# Patient Record
Sex: Male | Born: 1975 | Race: White | Hispanic: No | Marital: Single | Smoking: Current every day smoker
Health system: Southern US, Community
[De-identification: ages and names within clinical notes are randomized; demographics above are authoritative.]

## PROBLEM LIST (undated history)

## (undated) DIAGNOSIS — R911 Solitary pulmonary nodule: Secondary | ICD-10-CM

## (undated) DIAGNOSIS — J45909 Unspecified asthma, uncomplicated: Secondary | ICD-10-CM

## (undated) DIAGNOSIS — K759 Inflammatory liver disease, unspecified: Secondary | ICD-10-CM

## (undated) DIAGNOSIS — K219 Gastro-esophageal reflux disease without esophagitis: Secondary | ICD-10-CM

## (undated) DIAGNOSIS — R7303 Prediabetes: Secondary | ICD-10-CM

## (undated) HISTORY — DX: Unspecified asthma, uncomplicated: J45.909

## (undated) HISTORY — PX: GANGLION CYST EXCISION: SHX1691

## (undated) HISTORY — DX: Solitary pulmonary nodule: R91.1

## (undated) HISTORY — PX: OTHER SURGICAL HISTORY: SHX169

## (undated) HISTORY — DX: Gastro-esophageal reflux disease without esophagitis: K21.9

## (undated) HISTORY — DX: Inflammatory liver disease, unspecified: K75.9

---

## 2004-12-18 ENCOUNTER — Emergency Department: Payer: Self-pay | Admitting: General Practice

## 2005-03-23 ENCOUNTER — Emergency Department: Payer: Self-pay | Admitting: Emergency Medicine

## 2005-07-25 ENCOUNTER — Ambulatory Visit: Payer: Self-pay

## 2008-05-19 ENCOUNTER — Emergency Department: Payer: Self-pay | Admitting: Emergency Medicine

## 2008-05-19 ENCOUNTER — Other Ambulatory Visit: Payer: Self-pay

## 2008-05-20 ENCOUNTER — Other Ambulatory Visit: Payer: Self-pay

## 2008-05-21 ENCOUNTER — Other Ambulatory Visit: Payer: Self-pay

## 2008-05-21 ENCOUNTER — Emergency Department: Payer: Self-pay | Admitting: Emergency Medicine

## 2008-12-30 ENCOUNTER — Ambulatory Visit: Payer: Self-pay | Admitting: Orthopedic Surgery

## 2009-01-03 ENCOUNTER — Ambulatory Visit: Payer: Self-pay | Admitting: Orthopedic Surgery

## 2010-07-30 ENCOUNTER — Emergency Department: Payer: Self-pay | Admitting: Emergency Medicine

## 2016-03-06 ENCOUNTER — Emergency Department (HOSPITAL_COMMUNITY): Payer: BLUE CROSS/BLUE SHIELD

## 2016-03-06 ENCOUNTER — Emergency Department (HOSPITAL_COMMUNITY)
Admission: EM | Admit: 2016-03-06 | Discharge: 2016-03-07 | Disposition: A | Discharge status: Home / Self Care | Payer: BLUE CROSS/BLUE SHIELD | Attending: Emergency Medicine | Admitting: Emergency Medicine

## 2016-03-06 ENCOUNTER — Encounter (HOSPITAL_COMMUNITY): Payer: Self-pay

## 2016-03-06 DIAGNOSIS — F17200 Nicotine dependence, unspecified, uncomplicated: Secondary | ICD-10-CM | POA: Diagnosis not present

## 2016-03-06 DIAGNOSIS — R109 Unspecified abdominal pain: Secondary | ICD-10-CM | POA: Insufficient documentation

## 2016-03-06 DIAGNOSIS — R0602 Shortness of breath: Secondary | ICD-10-CM | POA: Insufficient documentation

## 2016-03-06 DIAGNOSIS — Z880 Allergy status to penicillin: Secondary | ICD-10-CM | POA: Insufficient documentation

## 2016-03-06 DIAGNOSIS — Z8719 Personal history of other diseases of the digestive system: Secondary | ICD-10-CM | POA: Diagnosis not present

## 2016-03-06 DIAGNOSIS — R0789 Other chest pain: Secondary | ICD-10-CM | POA: Diagnosis not present

## 2016-03-06 DIAGNOSIS — R5381 Other malaise: Secondary | ICD-10-CM | POA: Insufficient documentation

## 2016-03-06 DIAGNOSIS — R079 Chest pain, unspecified: Secondary | ICD-10-CM | POA: Diagnosis present

## 2016-03-06 DIAGNOSIS — R61 Generalized hyperhidrosis: Secondary | ICD-10-CM | POA: Diagnosis not present

## 2016-03-06 HISTORY — DX: Prediabetes: R73.03

## 2016-03-06 LAB — BASIC METABOLIC PANEL
Anion gap: 9 (ref 5–15)
BUN: 16 mg/dL (ref 6–20)
CHLORIDE: 109 mmol/L (ref 101–111)
CO2: 23 mmol/L (ref 22–32)
Calcium: 8.8 mg/dL — ABNORMAL LOW (ref 8.9–10.3)
Creatinine, Ser: 1.04 mg/dL (ref 0.61–1.24)
GFR calc Af Amer: >60 mL/min (ref >60)
GFR calc non Af Amer: >60 mL/min (ref >60)
GLUCOSE: 81 mg/dL (ref 65–99)
POTASSIUM: 4.4 mmol/L (ref 3.5–5.1)
Sodium: 141 mmol/L (ref 135–145)

## 2016-03-06 LAB — CBC
HEMATOCRIT: 48.1 % (ref 39.0–52.0)
Hemoglobin: 16.5 g/dL (ref 13.0–17.0)
MCH: 31.3 pg (ref 26.0–34.0)
MCHC: 34.3 g/dL (ref 30.0–36.0)
MCV: 91.3 fL (ref 78.0–100.0)
Platelets: 352 10*3/uL (ref 150–400)
RBC: 5.27 MIL/uL (ref 4.22–5.81)
RDW: 13.2 % (ref 11.5–15.5)
WBC: 9.2 10*3/uL (ref 4.0–10.5)

## 2016-03-06 LAB — I-STAT TROPONIN, ED
TROPONIN I, POC: 0.02 ng/mL (ref 0.00–0.08)
Troponin i, poc: 0 ng/mL (ref 0.00–0.08)

## 2016-03-06 LAB — LIPASE, BLOOD: Lipase: 30 U/L (ref 11–51)

## 2016-03-06 MED ORDER — FAMOTIDINE 20 MG PO TABS
20.0000 mg | ORAL_TABLET | Freq: Once | ORAL | Status: AC
  Administered 2016-03-07: 20 mg via ORAL
  Filled 2016-03-06: qty 1

## 2016-03-06 MED ORDER — GI COCKTAIL ~~LOC~~
30.0000 mL | Freq: Once | ORAL | Status: AC
  Administered 2016-03-06: 30 mL via ORAL
  Filled 2016-03-06: qty 30

## 2016-03-06 NOTE — ED Provider Notes (Signed)
CSN: [id number]     Arrival date & time 03/06/16  1734 History   First MD Initiated Contact with Patient 03/06/16 2030     Chief Complaint  Patient presents with  . Chest Pain     (Consider location/radiation/quality/duration/timing/severity/associated sxs/prior Treatment) HPI   Blood pressure 140/87, pulse 71, temperature 98.2 F (36.8 C), temperature source Oral, resp. rate 20, height 6\' 3"  (1.905 m), weight 114.306 kg, SpO2 96 %.  Frank Stevens is a 40 y.o. male  with PMHx significant for GERD, tobacco use and pre-diabetes who presents to the ED after 30 minute episode of CP. Pt reports CP is dull, and radiates to the left arm and shoulder. Associated sxs were "uncomfortable" abdominal pain, malaise, SOB, and diaphoresis. Pt reports he presented to the healthcare provider on staff at his place of work and she measured BP, BG, which were both WNL and gave him 1 dose of ASA which provided moderate relief. Pt reports this episode was followed by two similar CP episodes which resolved without intervention. Pt reports he is screened regularly with this workplace provider. Pt notes he had eaten [employer] about 1-1.5 hours before first episode. Pt denies fever, N/V, HA, changes in bowel movements, lower extremity pain or edema, or recent surgery or travel.   Past Medical History  Diagnosis Date  . Pre-diabetes    History reviewed. No pertinent past surgical history. No family history on file. Social History  Substance Use Topics  . Smoking status: Current Every Day Smoker  . Smokeless tobacco: None  . Alcohol Use: No    Review of Systems  10 systems reviewed and found to be negative, except as noted in the HPI.   Allergies  Penicillins  Home Medications   Prior to Admission medications   Not on File   BP 140/87 mmHg  Pulse 71  Temp(Src) 98.2 F (36.8 C) (Oral)  Resp 20  Ht 6\' 3"  (1.905 m)  Wt 114.306 kg  BMI 31.50 kg/m2  SpO2 96% Physical Exam  Constitutional: He is  oriented to person, place, and time. He appears well-developed and well-nourished. No distress.  HENT:  Head: Normocephalic and atraumatic.  Mouth/Throat: Oropharynx is clear and moist.  Eyes: Conjunctivae and EOM are normal. Pupils are equal, round, and reactive to light.  Neck: Normal range of motion. No JVD present. No tracheal deviation present.  Cardiovascular: Normal rate, regular rhythm and intact distal pulses.   Radial pulse equal bilaterally  Pulmonary/Chest: Effort normal and breath sounds normal. No stridor. No respiratory distress. He has no wheezes. He has no rales. He exhibits no tenderness.  Abdominal: Soft. He exhibits no distension and no mass. There is no tenderness. There is no rebound and no guarding.  Musculoskeletal: Normal range of motion. He exhibits no edema or tenderness.  No calf asymmetry, superficial collaterals, palpable cords, edema, Homans sign negative bilaterally.    Neurological: He is alert and oriented to person, place, and time.  Skin: Skin is warm. He is not diaphoretic.  Psychiatric: He has a normal mood and affect.  Nursing note and vitals reviewed.   ED Course  Procedures (including critical care time) Labs Review Labs Reviewed  BASIC METABOLIC PANEL - Abnormal; Notable for the following:    Calcium 8.8 (*)    All other components within normal limits  CBC  I-STAT TROPOININ, ED    Imaging Review Dg Chest 2 View  03/06/2016  CLINICAL DATA:  40 year old male with chest pain and shortness of  breath EXAM: CHEST  2 VIEW COMPARISON:  Chest radiograph dated 05/21/2008 FINDINGS: The heart size and mediastinal contours are within normal limits. Both lungs are clear. The visualized skeletal structures are unremarkable. IMPRESSION: No active cardiopulmonary disease. Electronically Signed   By: Anner Crete M.D.   On: 03/06/2016 18:32   I have personally reviewed and evaluated these images and lab results as part of my medical decision-making.    EKG Interpretation None      MDM   Final diagnoses:  Atypical chest pain  Tobacco use disorder    Filed Vitals:   03/06/16 2130 03/06/16 2145 03/06/16 2200 03/06/16 2215  BP: 131/71 135/83 130/84 138/92  Pulse: 65 65 61 68  Temp:      TempSrc:      Resp: 20 17 14 12   Height:      Weight:      SpO2: 98% 97% 96% 98%    Medications  gi cocktail (Maalox,Lidocaine,Donnatal) (30 mLs Oral Given 03/06/16 2123)    Frank Stevens is 40 y.o. male presenting with Chest pain radiating to the left shoulder with associated shortness of breath and diaphoresis, he said 3 episodes lasting approximately 30 minutes, this appears to be tied to eating. Patient has history of GERD, he is prediabetic and also a smoker. Low risk by heart score, EKG with no acute abnormalities, troponin is negative, patient will be given GI cocktail, will check LFTs and  Trop 0.02, not crossing over, Patient given cardiology referral, since he discussed cessation.  Evaluation does not show pathology that would require ongoing emergent intervention or inpatient treatment. Pt is hemodynamically stable and mentating appropriately. Discussed findings and plan with patient/guardian, who agrees with care plan. All questions answered. Return precautions discussed and outpatient follow up given.     Monico Blitz, PA-C 03/07/16 [place], MD 03/08/16 [phone]

## 2016-03-06 NOTE — ED Notes (Signed)
Pt states he started having left side CP that radiated down his left arm today around 1500 associated with SOB and sweating, "I was feeling really off."

## 2016-03-07 LAB — HEPATIC FUNCTION PANEL
ALT: 36 U/L (ref 17–63)
AST: 25 U/L (ref 15–41)
Albumin: 4.1 g/dL (ref 3.5–5.0)
Alkaline Phosphatase: 60 U/L (ref 38–126)
BILIRUBIN DIRECT: 0.2 mg/dL (ref 0.1–0.5)
BILIRUBIN INDIRECT: 0.3 mg/dL (ref 0.3–0.9)
TOTAL PROTEIN: 7.1 g/dL (ref 6.5–8.1)
Total Bilirubin: 0.5 mg/dL (ref 0.3–1.2)

## 2016-03-07 NOTE — Discharge Instructions (Signed)
Please follow with your primary care doctor in the next 2 days for a check-up. They must obtain records for further management.  ° °Do not hesitate to return to the Emergency Department for any new, worsening or concerning symptoms.  ° ° °Nonspecific Chest Pain  °Chest pain can be caused by many different conditions. There is always a chance that your pain could be related to something serious, such as a heart attack or a blood clot in your lungs. Chest pain can also be caused by conditions that are not life-threatening. If you have chest pain, it is very important to follow up with your health care provider. °CAUSES  °Chest pain can be caused by: °· Heartburn. °· Pneumonia or bronchitis. °· Anxiety or stress. °· Inflammation around your heart (pericarditis) or lung (pleuritis or pleurisy). °· A blood clot in your lung. °· A collapsed lung (pneumothorax). It can develop suddenly on its own (spontaneous pneumothorax) or from trauma to the chest. °· Shingles infection (varicella-zoster virus). °· Heart attack. °· Damage to the bones, muscles, and cartilage that make up your chest wall. This can include: °¨ Bruised bones due to injury. °¨ Strained muscles or cartilage due to frequent or repeated coughing or overwork. °¨ Fracture to one or more ribs. °¨ Sore cartilage due to inflammation (costochondritis). °RISK FACTORS  °Risk factors for chest pain may include: °· Activities that increase your risk for trauma or injury to your chest. °· Respiratory infections or conditions that cause frequent coughing. °· Medical conditions or overeating that can cause heartburn. °· Heart disease or family history of heart disease. °· Conditions or health behaviors that increase your risk of developing a blood clot. °· Having had chicken pox (varicella zoster). °SIGNS AND SYMPTOMS °Chest pain can feel like: °· Burning or tingling on the surface of your chest or deep in your chest. °· Crushing, pressure, aching, or squeezing  pain. °· Dull or sharp pain that is worse when you move, cough, or take a deep breath. °· Pain that is also felt in your back, neck, shoulder, or arm, or pain that spreads to any of these areas. °Your chest pain may come and go, or it may stay constant. °DIAGNOSIS °Lab tests or other studies may be needed to find the cause of your pain. Your health care provider may have you take a test called an ambulatory ECG (electrocardiogram). An ECG records your heartbeat patterns at the time the test is performed. You may also have other tests, such as: °· Transthoracic echocardiogram (TTE). During echocardiography, sound waves are used to create a picture of all of the heart structures and to look at how blood flows through your heart. °· Transesophageal echocardiogram (TEE). This is a more advanced imaging test that obtains images from inside your body. It allows your health care provider to see your heart in finer detail. °· Cardiac monitoring. This allows your health care provider to monitor your heart rate and rhythm in real time. °· Holter monitor. This is a portable device that records your heartbeat and can help to diagnose abnormal heartbeats. It allows your health care provider to track your heart activity for several days, if needed. °· Stress tests. These can be done through exercise or by taking medicine that makes your heart beat more quickly. °· Blood tests. °· Imaging tests. °TREATMENT  °Your treatment depends on what is causing your chest pain. Treatment may include: °· Medicines. These may include: °¨ Acid blockers for heartburn. °¨ Anti-inflammatory medicine. °¨ Pain   medicine for inflammatory conditions.  Antibiotic medicine, if an infection is present.  Medicines to dissolve blood clots.  Medicines to treat coronary artery disease.  Supportive care for conditions that do not require medicines. This may include:  Resting.  Applying heat or cold packs to injured areas.  Limiting activities  until pain decreases. HOME CARE INSTRUCTIONS  If you were prescribed an antibiotic medicine, finish it all even if you start to feel better.  Avoid any activities that bring on chest pain.  Do not use any tobacco products, including cigarettes, chewing tobacco, or electronic cigarettes. If you need help quitting, ask your health care provider.  Do not drink alcohol.  Take medicines only as directed by your health care provider.  Keep all follow-up visits as directed by your health care provider. This is important. This includes any further testing if your chest pain does not go away.  If heartburn is the cause for your chest pain, you may be told to keep your head raised (elevated) while sleeping. This reduces the chance that acid will go from your stomach into your esophagus.  Make lifestyle changes as directed by your health care provider. These may include:  Getting regular exercise. Ask your health care provider to suggest some activities that are safe for you.  Eating a heart-healthy diet. A registered dietitian can help you to learn healthy eating options.  Maintaining a healthy weight.  Managing diabetes, if necessary.  Reducing stress. SEEK MEDICAL CARE IF:  Your chest pain does not go away after treatment.  You have a rash with blisters on your chest.  You have a fever. SEEK IMMEDIATE MEDICAL CARE IF:   Your chest pain is worse.  You have an increasing cough, or you cough up blood.  You have severe abdominal pain.  You have severe weakness.  You faint.  You have chills.  You have sudden, unexplained chest discomfort.  You have sudden, unexplained discomfort in your arms, back, neck, or jaw.  You have shortness of breath at any time.  You suddenly start to sweat, or your skin gets clammy.  You feel nauseous or you vomit.  You suddenly feel light-headed or dizzy.  Your heart begins to beat quickly, or it feels like it is skipping beats. These  symptoms may represent a serious problem that is an emergency. Do not wait to see if the symptoms will go away. Get medical help right away. Call your local emergency services (911 in the [place] Do not drive yourself to the hospital.   This information is not intended to replace advice given to you by your health care provider. Make sure you discuss any questions you have with your health care provider.   Document Released: 07/25/2005 Document Revised: 11/05/2014 Document Reviewed: 05/21/2014 Elsevier Interactive Patient Education [employer].

## 2017-02-22 LAB — VITAMIN D 25 HYDROXY (VIT D DEFICIENCY, FRACTURES): Vit D, 25-Hydroxy: 19.5

## 2017-02-22 LAB — CBC AND DIFFERENTIAL
HEMATOCRIT: 49 (ref 41–53)
HEMOGLOBIN: 17.3 (ref 13.5–17.5)
Neutrophils Absolute: 6
Platelets: 361 (ref 150–399)
WBC: 9.4

## 2017-02-22 LAB — BASIC METABOLIC PANEL
BUN: 12 (ref 4–21)
CREATININE: 1 (ref 0.6–1.3)
Glucose: 98
POTASSIUM: 4.7 (ref 3.4–5.3)
SODIUM: 139 (ref 137–147)

## 2017-02-22 LAB — HEMOGLOBIN A1C: HEMOGLOBIN A1C: 5.9

## 2017-02-22 LAB — LIPID PANEL
CHOLESTEROL: 178 (ref 0–200)
HDL: 28 — AB (ref 35–70)
LDL Cholesterol: 103
TRIGLYCERIDES: 237 — AB (ref 40–160)

## 2017-02-22 LAB — HEPATIC FUNCTION PANEL
ALT: 14 (ref 10–40)
AST: 15 (ref 14–40)
Alkaline Phosphatase: 61 (ref 25–125)
BILIRUBIN, TOTAL: 0.4

## 2017-02-22 LAB — PSA: PSA: 1.2

## 2017-02-22 LAB — TSH: TSH: 0.72 (ref 0.41–5.90)

## 2017-02-22 LAB — IRON,TIBC AND FERRITIN PANEL: IRON: 118

## 2018-09-30 ENCOUNTER — Encounter: Payer: Self-pay | Admitting: Family Medicine

## 2018-09-30 ENCOUNTER — Ambulatory Visit (INDEPENDENT_AMBULATORY_CARE_PROVIDER_SITE_OTHER): Payer: 59 | Admitting: Family Medicine

## 2018-09-30 VITALS — BP 134/76 | HR 83 | Temp 98.3°F | Resp 12 | Ht 75.0 in | Wt 269.0 lb

## 2018-09-30 DIAGNOSIS — Z0000 Encounter for general adult medical examination without abnormal findings: Secondary | ICD-10-CM | POA: Diagnosis not present

## 2018-09-30 DIAGNOSIS — Z1331 Encounter for screening for depression: Secondary | ICD-10-CM

## 2018-09-30 DIAGNOSIS — E669 Obesity, unspecified: Secondary | ICD-10-CM | POA: Diagnosis not present

## 2018-09-30 DIAGNOSIS — Z720 Tobacco use: Secondary | ICD-10-CM

## 2018-09-30 MED ORDER — NICOTINE 14 MG/24HR TD PT24: 14.0000 mg | MEDICATED_PATCH | Freq: Every day | TRANSDERMAL | 0 refills | Status: AC

## 2018-09-30 MED ORDER — NICOTINE 7 MG/24HR TD PT24: 7.0000 mg | MEDICATED_PATCH | Freq: Every day | TRANSDERMAL | 0 refills | Status: AC

## 2018-09-30 MED ORDER — NICOTINE 21 MG/24HR TD PT24: 21.0000 mg | MEDICATED_PATCH | Freq: Every day | TRANSDERMAL | 0 refills | Status: AC

## 2018-09-30 NOTE — Patient Instructions (Addendum)
When you get your labs - have them sent to [place].   Based on what you have drawn at work, we may need to have to come back for a lab appointment  Quitting smoking is the best thing you can do for your health!!!  Here is how you should use the patches  Week 1-4: 21 mg patch Week 5&6: 14 mg patch Week 7&8: 7 mg patch  Then stop.   You may still get cravings for cigarettes. Sometimes it is helpful to pick a specific Quit Date - the last day you will have a cigarette.

## 2018-09-30 NOTE — Assessment & Plan Note (Signed)
Interested in diet/exercise - but elected to focus on quitting smoking for the next few months. Will readdress lifestyle later. Due for labs at work - will give them to the clinic. Plan to review for lipids, CMP, and HgbA1c.

## 2018-09-30 NOTE — Progress Notes (Signed)
Subjective:     Frank Stevens is a 42 y.o. 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, second time had too many side effects - tried vaping w/o success - nicotine inhalers/patches - considering hypnotizing    #weight  - recognizes that he is overweight - tried a shake diet - with some success but then gained it back - works 12 hour days, 6 days a week - averages 15,000 steps per day currently with work  Immunizations - flu up to date - not sure about Tetanus  Gets labs with work - due to for labs in 3 weeks - will follow-up for cholesterol and diabetes screening  Cancer screening - none due at this time  Vision issues - competed course of steroid eye drop - will get watery eyes while watching TV and get blurry vision    Review of Systems  Constitutional: Negative for chills and fever.  HENT: Negative for congestion, ear pain and sinus pain.   Eyes: Positive for discharge (watery).  Respiratory: Positive for cough and wheezing. Negative for chest tightness and shortness of breath.   Cardiovascular: Positive for leg swelling (chronic left knee). Negative for chest pain and palpitations.  Gastrointestinal: Negative for abdominal pain, nausea and vomiting.  Endocrine: Negative for cold intolerance and heat intolerance.  Genitourinary: Negative.   Musculoskeletal: Negative for arthralgias and myalgias.  Skin: Negative for rash.  Allergic/Immunologic: Negative.   Neurological: Negative for dizziness, weakness, numbness and headaches.  Hematological: Does not bruise/bleed easily.  Psychiatric/Behavioral: Negative for dysphoric mood. The patient is not nervous/anxious.      Social History   Tobacco Use  Smoking Status Current Every Day Smoker  . Packs/day: 1.50  . Years: 30.00  . Pack years: 45.00  . Types: Cigarettes  Smokeless Tobacco Current User  . Types: Chew   Past  Medical History:  Diagnosis Date  . Asthma    childhood  . GERD (gastroesophageal reflux disease)   . Hepatitis    age 70  . Pre-diabetes    Past Surgical History:  Procedure Laterality Date  . GANGLION CYST EXCISION Left   . lymph node     lymph node removed under left arm due to cat scratch fever. as a kid   Family History  Problem Relation Age of Onset  . Arthritis Mother   . COPD Mother   . Diabetes Mother   . Heart disease Mother   . Colon cancer Mother 16  . Congestive Heart Failure Mother   . Alcohol abuse Father   . Drug abuse Father   . Early death Father   . Kidney disease Father   . Cirrhosis Father   . Asthma Sister    Social History   Socioeconomic History  . Marital status: Married    Spouse name: Anderson Malta  . Number of children: 1  . Years of education: GED, associate degree  . Highest education level: Not on file  Occupational History  . Not on file  Social Needs  . Financial resource strain: Not hard at all  . Food insecurity:    Worry: Not on file    Inability: Not on file  . Transportation needs:    Medical: Not on file    Non-medical: Not on file  Tobacco Use  . Smoking status: Current Every Day Smoker    Packs/day: 1.50    Years: 30.00  Pack years: 45.00    Types: Cigarettes  . Smokeless tobacco: Current User    Types: Chew  Substance and Sexual Activity  . Alcohol use: Yes    Comment: 1-2 times per year  . Drug use: Not Currently  . Sexual activity: Yes    Birth control/protection: Pill  Lifestyle  . Physical activity:    Days per week: Not on file    Minutes per session: Not on file  . Stress: Not on file  Relationships  . Social connections:    Talks on phone: Not on file    Gets together: Not on file    Attends religious service: Not on file    Active member of club or organization: Not on file    Attends meetings of clubs or organizations: Not on file    Relationship status: Not on file  Other Topics Concern  . 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 He appears well-developed and well-nourished. No distress.  HENT:  Head: Normocephalic and atraumatic.  Right Ear: Tympanic membrane and ear canal normal.  Left Ear: Tympanic membrane and ear canal normal.  Nose: Nose normal.  Mouth/Throat: Uvula is midline and oropharynx is clear and moist.  Eyes: Pupils are equal, round, and reactive to light. Conjunctivae and EOM are normal. No scleral icterus.  Neck: Normal range of motion. Neck supple.  Cardiovascular: Normal rate, regular rhythm and normal heart sounds.  No murmur heard. Pulmonary/Chest: Effort normal and breath sounds normal. No respiratory distress. He has no wheezes.  Abdominal: Soft. Bowel sounds are normal. He exhibits no distension and no mass. There is no tenderness. There is no guarding.  Musculoskeletal: Normal range of motion.  Lymphadenopathy:    He has no cervical adenopathy.  Neurological: He is alert and oriented to person, place, and time.  Skin: Skin is warm and dry. Capillary refill takes less than 2 seconds. He is not diaphoretic.  Psychiatric: He has a normal mood and affect.     Office Visit from 09/30/2018 in [place] at [facility]  PHQ-9 Total Score  4       Plan:   Well Male physical  Problem List Items Addressed This Visit        Other   Tobacco use    Start patches today with taper. Notify me if having a hard time with taper.       Obesity (BMI 30-39.9)    Interested in diet/exercise - but elected to focus on quitting smoking for the next few months. Will readdress lifestyle later. Due for labs at work -  will give them to the clinic. Plan to review for lipids, CMP, and HgbA1c.        Other Visit Diagnoses    Annual physical exam    -  Primary     No cancer screening Discussed healthy lifestyle and smoking cessation today Will follow-up work labs Return in 3-6 months to check in on smoking cessation and discuss diet/exercise BP good today Will get records for Tetanus Flu up to date   Tobacco Use/Cessation.  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     I advised patient to quit, and offered support. Nicotine patches beginning at 21 mg.   Return in about 3 months (around 12/30/2018).  Lesleigh Noe, MD

## 2018-09-30 NOTE — Assessment & Plan Note (Signed)
Start patches today with taper. Notify me if having a hard time with taper.

## 2018-10-13 ENCOUNTER — Telehealth: Payer: Self-pay | Admitting: Family Medicine

## 2018-10-13 NOTE — Telephone Encounter (Signed)
Pt dropped off lab results from [place] on cart.

## 2018-10-14 NOTE — Telephone Encounter (Signed)
Left message for patient to call me back. 

## 2018-10-14 NOTE — Telephone Encounter (Signed)
Spoke with patient. No record of Tetanus immunization. Patient advised we will update this on his next visit. Patient is agreeable.

## 2018-10-14 NOTE — Telephone Encounter (Signed)
Pt returned call to Anastasiya.

## 2018-10-16 LAB — T4: THYROXINE (T4): 6.4

## 2018-10-16 LAB — FREE THYROXINE INDEX: Free Thyroxine Index: 1.7

## 2018-10-16 LAB — T3 UPTAKE: T3 UPTAKE: 27

## 2018-12-08 ENCOUNTER — Ambulatory Visit (INDEPENDENT_AMBULATORY_CARE_PROVIDER_SITE_OTHER): Payer: 59 | Admitting: Family Medicine

## 2018-12-08 ENCOUNTER — Encounter: Payer: Self-pay | Admitting: Family Medicine

## 2018-12-08 VITALS — BP 134/82 | HR 78 | Temp 98.2°F | Ht 75.0 in | Wt 273.0 lb

## 2018-12-08 DIAGNOSIS — S46811A Strain of other muscles, fascia and tendons at shoulder and upper arm level, right arm, initial encounter: Secondary | ICD-10-CM | POA: Diagnosis not present

## 2018-12-08 DIAGNOSIS — M25511 Pain in right shoulder: Secondary | ICD-10-CM

## 2018-12-08 NOTE — Patient Instructions (Addendum)
#  Shoulder Pain - Ibuprofen 800 mg (4 tablets) every 8 hours - Ice 2-3 times a day if you can - Tylenol 1000 mg every 6 hours as needed  May be either a be impingement injury or rotator cuff injury. Do not think anything is broken  Return next week if not improved or let me know if things are worsening

## 2018-12-08 NOTE — Progress Notes (Signed)
Subjective:     Frank Stevens is a 43 y.o. male presenting for [place] last night-12/27/2018, slipped on stairs at home. He tried to catch himself as he was falling down. Somehow he hurt his right arm. Painful to use and move his right arm. )     HPI  #Right Arm pain - Fell last night 12/07/2018 - fell and slid down the stairs and landed on his bottom - now having some arm pain - entire shoulder is painful - could not put his truck into gear this morning - was able to move arm last night - had to pick his arm up to help with shaving this morning - no hand pain, no arm pain - no numbness, tingling, or loss of sensation - nothing out of socket    Review of Systems  Constitutional: Negative for chills and fever.  Neurological: Negative for syncope, weakness and numbness.     Social History   Tobacco Use  Smoking Status Current Every Day Smoker  . Packs/day: 1.50  . Years: 30.00  . Pack years: 45.00  . Types: Cigarettes  Smokeless Tobacco Current User  . Types: Chew        Objective:    BP Readings from Last 3 Encounters:  12/08/18 134/82  09/30/18 134/76  03/07/16 119/73   Wt Readings from Last 3 Encounters:  12/08/18 273 lb (123.8 kg)  09/30/18 269 lb (122 kg)  03/06/16 252 lb (114.3 kg)    BP 134/82   Pulse 78   Temp 98.2 F (36.8 C)   Ht 6\' 3"  (1.905 m)   Wt 273 lb (123.8 kg)   SpO2 94%   BMI 34.12 kg/m    Physical Exam Constitutional:      Appearance: Normal appearance. He is not ill-appearing or diaphoretic.  HENT:     Right Ear: External ear normal.     Left Ear: External ear normal.     Nose: Nose normal.  Eyes:     General: No scleral icterus.    Extraocular Movements: Extraocular movements intact.     Conjunctiva/sclera: Conjunctivae normal.  Neck:     Musculoskeletal: Neck supple.  Cardiovascular:     Rate and Rhythm: Normal rate.  Pulmonary:     Effort: Pulmonary effort is normal.  Musculoskeletal:     Comments: Shoulder:   Inspection: no bruising, no swelling, no erythema Palpation: No TTP along the scapula or clavicle. Some muscle TTP ROM: Pain once his shoulder reaches 80 degrees, able to fully abduct with pain. No issues with internal/external rotation Strength: normal biceps/triceps strength w/o pain, weak supraspinatus testing with weakness on empty can as well as positive drop arm test. Some pain with cross body movement  Skin:    General: Skin is warm and dry.  Neurological:     Mental Status: He is alert. Mental status is at baseline.  Psychiatric:        Mood and Affect: Mood normal.           Assessment & Plan:   Problem List Items Addressed This Visit      Musculoskeletal and Integument   Strain of supraspinatus muscle or tendon    Suspect shoulder pain 2/2 to supraspinatous injury. Difficult to determine if full tear, inflammation or partial tear. Recommend watch and wait, NSAIDs/ICE. If no improvement in next 1-2 weeks will get MRI       Other Visit Diagnoses    Acute pain of right shoulder    -  Primary       Return in about 1 week (around 12/15/2018).  Lesleigh Noe, MD

## 2018-12-08 NOTE — Assessment & Plan Note (Signed)
Suspect shoulder pain 2/2 to supraspinatous injury. Difficult to determine if full tear, inflammation or partial tear. Recommend watch and wait, NSAIDs/ICE. If no improvement in next 1-2 weeks will get MRI

## 2018-12-15 ENCOUNTER — Encounter: Payer: Self-pay | Admitting: Family Medicine

## 2018-12-15 ENCOUNTER — Ambulatory Visit (INDEPENDENT_AMBULATORY_CARE_PROVIDER_SITE_OTHER): Payer: 59 | Admitting: Family Medicine

## 2018-12-15 VITALS — BP 136/82 | HR 82 | Temp 98.5°F | Ht 75.0 in | Wt 272.5 lb

## 2018-12-15 DIAGNOSIS — M75101 Unspecified rotator cuff tear or rupture of right shoulder, not specified as traumatic: Secondary | ICD-10-CM | POA: Diagnosis not present

## 2018-12-15 NOTE — Assessment & Plan Note (Signed)
Significant improvement from last week. Discussed that likely a tear but may improve on its own. Has access to free Chiropractor and is doing home exercises. Return if not improvement in next 2-3 months.

## 2018-12-15 NOTE — Patient Instructions (Addendum)
-   Continue taking Tylenol and ibuprofen as needed  - Come back if not improving in 2-3 months

## 2018-12-15 NOTE — Progress Notes (Signed)
   Subjective:     Frank Stevens is a 43 y.o. male presenting for Shoulder Pain (1 week follow up on right shoulder. he did see a chiropractor last tuesday and was told that he has a slight tear in his shoulder. His shoulder/pain was better but then he slipped on stairs yesterday again but did not fall just moved his arm a certain way that makes it hurt now.)     HPI   #Shoulder Pain - Saw the chiropractor - slipped on the stairs again - arm flung back - overall feels his arm is getting better - seeing chiropractor - which is free at work who specializes in repetive movement exercise    Review of Systems  Constitutional: Negative for chills and fever.  Neurological: Negative for numbness.     Social History   Tobacco Use  Smoking Status Current Every Day Smoker  . Packs/day: 1.50  . Years: 30.00  . Pack years: 45.00  . Types: Cigarettes  Smokeless Tobacco Current User  . Types: Chew        Objective:    BP Readings from Last 3 Encounters:  12/15/18 136/82  12/08/18 134/82  09/30/18 134/76   Wt Readings from Last 3 Encounters:  12/15/18 272 lb 8 oz (123.6 kg)  12/08/18 273 lb (123.8 kg)  09/30/18 269 lb (122 kg)    BP 136/82   Pulse 82   Temp 98.5 F (36.9 C)   Ht 6\' 3"  (1.905 m)   Wt 272 lb 8 oz (123.6 kg)   BMI 34.06 kg/m    Physical Exam Constitutional:      Appearance: Normal appearance. He is not ill-appearing or diaphoretic.  HENT:     Right Ear: External ear normal.     Left Ear: External ear normal.     Nose: Nose normal.  Eyes:     General: No scleral icterus.    Extraocular Movements: Extraocular movements intact.     Conjunctiva/sclera: Conjunctivae normal.  Neck:     Musculoskeletal: Neck supple.  Cardiovascular:     Rate and Rhythm: Normal rate.  Pulmonary:     Effort: Pulmonary effort is normal.  Musculoskeletal:     Comments: Right shoulder:  ROM improved from prior Negative drop test Supraspinatus testing with improved  strength, but still pain  Skin:    General: Skin is warm and dry.  Neurological:     Mental Status: He is alert. Mental status is at baseline.  Psychiatric:        Mood and Affect: Mood normal.           Assessment & Plan:   Problem List Items Addressed This Visit      Musculoskeletal and Integument   Tear of right supraspinatus tendon - Primary    Significant improvement from last week. Discussed that likely a tear but may improve on its own. Has access to free Chiropractor and is doing home exercises. Return if not improvement in next 2-3 months.           Return in about 2 months (around 02/13/2019).  Lesleigh Noe, MD

## 2018-12-17 ENCOUNTER — Ambulatory Visit (INDEPENDENT_AMBULATORY_CARE_PROVIDER_SITE_OTHER)
Admission: RE | Admit: 2018-12-17 | Discharge: 2018-12-17 | Disposition: A | Discharge status: Home / Self Care | Payer: 59 | Source: Ambulatory Visit | Attending: Family Medicine | Admitting: Family Medicine

## 2018-12-17 ENCOUNTER — Ambulatory Visit (INDEPENDENT_AMBULATORY_CARE_PROVIDER_SITE_OTHER): Payer: 59 | Admitting: Family Medicine

## 2018-12-17 ENCOUNTER — Encounter: Payer: Self-pay | Admitting: Family Medicine

## 2018-12-17 ENCOUNTER — Telehealth: Payer: Self-pay | Admitting: Family Medicine

## 2018-12-17 VITALS — BP 122/60 | HR 90 | Temp 98.6°F | Resp 14 | Ht 75.0 in | Wt 268.5 lb

## 2018-12-17 DIAGNOSIS — K5792 Diverticulitis of intestine, part unspecified, without perforation or abscess without bleeding: Secondary | ICD-10-CM | POA: Diagnosis not present

## 2018-12-17 DIAGNOSIS — R1032 Left lower quadrant pain: Secondary | ICD-10-CM | POA: Diagnosis not present

## 2018-12-17 DIAGNOSIS — R911 Solitary pulmonary nodule: Secondary | ICD-10-CM

## 2018-12-17 HISTORY — DX: Solitary pulmonary nodule: R91.1

## 2018-12-17 LAB — BASIC METABOLIC PANEL
BUN: 16 mg/dL (ref 6–23)
CHLORIDE: 102 meq/L (ref 96–112)
CO2: 27 meq/L (ref 19–32)
CREATININE: 1.09 mg/dL (ref 0.40–1.50)
Calcium: 9.4 mg/dL (ref 8.4–10.5)
GFR: 73.2 mL/min (ref >60.00)
Glucose, Bld: 93 mg/dL (ref 70–99)
POTASSIUM: 4.2 meq/L (ref 3.5–5.1)
SODIUM: 136 meq/L (ref 135–145)

## 2018-12-17 MED ORDER — IOPAMIDOL (ISOVUE-300) INJECTION 61%
100.0000 mL | Freq: Once | INTRAVENOUS | Status: AC | PRN
  Administered 2018-12-17: 100 mL via INTRAVENOUS

## 2018-12-17 MED ORDER — METRONIDAZOLE 500 MG PO TABS: 500.0000 mg | ORAL_TABLET | Freq: Three times a day (TID) | ORAL | 0 refills | Status: AC

## 2018-12-17 MED ORDER — CIPROFLOXACIN HCL 500 MG PO TABS: 500.0000 mg | ORAL_TABLET | Freq: Two times a day (BID) | ORAL | 0 refills | Status: DC

## 2018-12-17 NOTE — Progress Notes (Signed)
Subjective:     Frank Stevens is a 43 y.o. male presenting for Abdominal Pain (Developed suddenly yesterday 12/16/2018. Pain located in the LLQ. Pain never completely goes away, described as sharp at times and sometimes dull/achy pain. Pain does radiate at times to the RLQ and his back and bilateral groin areas. Feels like he has to have a bowel movement type of pain. last bowel movement this morning but it was a small amount, bowel was softish. Pain does improve some after passing gas but comes right back. No vomiting. Sweating when pain gets severe. has taking GasX and Pepto)     Abdominal Pain  This is a new problem. The current episode started yesterday. The onset quality is sudden. The problem occurs constantly. The pain is located in the LLQ. The pain is at a severity of 8/10. The quality of the pain is a sensation of fullness and sharp. The abdominal pain radiates to the back, pelvis and RLQ. Associated symptoms include anorexia, belching, a fever, frequency and melena. Pertinent negatives include no constipation, diarrhea, dysuria, flatus, hematochezia, hematuria, myalgias, nausea or vomiting. The pain is aggravated by certain positions. The pain is relieved by passing flatus. Treatments tried: gas-x, peptobismal. The treatment provided no relief. There is no history of abdominal surgery.     Review of Systems  Constitutional: Positive for chills and fever.  HENT: Negative for congestion.   Respiratory: Negative for cough.   Cardiovascular: Negative for chest pain.  Gastrointestinal: Positive for abdominal pain, anorexia, blood in stool and melena. Negative for constipation, diarrhea, flatus, hematochezia, nausea and vomiting.  Genitourinary: Positive for difficulty urinating, flank pain and frequency. Negative for dysuria, hematuria, scrotal swelling and testicular pain.  Musculoskeletal: Negative for myalgias.     Social History   Tobacco Use  Smoking Status Current Every Day  Smoker  . Packs/day: 1.50  . Years: 30.00  . Pack years: 45.00  . Types: Cigarettes  Smokeless Tobacco Current User  . Types: Chew        Objective:    BP Readings from Last 3 Encounters:  12/17/18 122/60  12/15/18 136/82  12/08/18 134/82   Wt Readings from Last 3 Encounters:  12/17/18 268 lb 8 oz (121.8 kg)  12/15/18 272 lb 8 oz (123.6 kg)  12/08/18 273 lb (123.8 kg)    BP 122/60   Pulse 90   Temp 98.6 F (37 C)   Resp 14   Ht 6\' 3"  (1.905 m)   Wt 268 lb 8 oz (121.8 kg)   SpO2 96%   BMI 33.56 kg/m    Physical Exam Constitutional:      Appearance: Normal appearance. He is not ill-appearing or diaphoretic.  HENT:     Right Ear: External ear normal.     Left Ear: External ear normal.     Nose: Nose normal.  Eyes:     General: No scleral icterus.    Extraocular Movements: Extraocular movements intact.     Conjunctiva/sclera: Conjunctivae normal.  Neck:     Musculoskeletal: Neck supple.  Cardiovascular:     Rate and Rhythm: Normal rate and regular rhythm.     Heart sounds: No murmur.  Pulmonary:     Effort: Pulmonary effort is normal. No respiratory distress.     Breath sounds: Normal breath sounds. No wheezing.  Abdominal:     General: Abdomen is flat. Bowel sounds are decreased. There is no distension.     Palpations: Abdomen is soft.  Tenderness: There is abdominal tenderness in the suprapubic area and left lower quadrant. There is guarding. There is no right CVA tenderness, left CVA tenderness or rebound. Negative signs include Murphy's sign.  Skin:    General: Skin is warm and dry.  Neurological:     Mental Status: He is alert. Mental status is at baseline.  Psychiatric:        Mood and Affect: Mood normal.           Assessment & Plan:   Problem List Items Addressed This Visit    None    Visit Diagnoses    Diverticulitis    -  Primary   Relevant Medications   metroNIDAZOLE (FLAGYL) 500 MG tablet   ciprofloxacin (CIPRO) 500 MG tablet    Other Relevant Orders   CT Abdomen Pelvis W Contrast   Basic metabolic panel   Left lower quadrant abdominal pain       Relevant Orders   CT Abdomen Pelvis W Contrast   Basic metabolic panel     Exam most concerning for diverticular disease. Stat CT ordered to evaluate for rupture and rule out other causes. Abx provided   Return in about 2 days (around 12/19/2018).  Lesleigh Noe, MD

## 2018-12-17 NOTE — Telephone Encounter (Signed)
Called to discuss results of imaging. Went directly to [employer]. And mailbox was full.   Relayed results of CT scan.   1) Diverticulitis - pt had just picked up Abx  2) Signs of fatty liver -- will need LFTs in the future  3) Some lung nodules, will need CT in the future to follow given smoking history  4) Some signs on the imaging that we may want to get a colonoscopy when he is over the infection.   He expressed understanding. Discussed red flags and when to go the ER if no improvement.

## 2018-12-17 NOTE — Patient Instructions (Addendum)
Before you leave:  1) Get labs 2) Talk with Rosaria Ferries (Referrals) to schedule CT scan   Diverticulitis  Diverticulitis is when small pockets in your large intestine (colon) get infected or swollen. This causes stomach pain and watery poop (diarrhea). These pouches are called diverticula. They form in people who have a condition called diverticulosis. Follow these instructions at home: Medicines  Take over-the-counter and prescription medicines only as told by your doctor. These include: ? Antibiotics. ? Pain medicines. ? Fiber pills. ? Probiotics. ? Stool softeners.  Do not drive or use heavy machinery while taking prescription pain medicine.  If you were prescribed an antibiotic, take it as told. Do not stop taking it even if you feel better. General instructions   Follow a diet as told by your doctor.  When you feel better, your doctor may tell you to change your diet. You may need to eat a lot of fiber. Fiber makes it easier to poop (have bowel movements). Healthy foods with fiber include: ? Berries. ? Beans. ? Lentils. ? Green vegetables.  Exercise 3 or more times a week. Aim for 30 minutes each time. Exercise enough to sweat and make your heart beat faster.  Keep all follow-up visits as told. This is important. You may need to have an exam of the large intestine. This is called a colonoscopy. Contact a doctor if:  Your pain does not get better.  You have a hard time eating or drinking.  You are not pooping like normal. Get help right away if:  Your pain gets worse.  Your problems do not get better.  Your problems get worse very fast.  You have a fever.  You throw up (vomit) more than one time.  You have poop that is: ? Bloody. ? Black. ? Tarry. Summary  Diverticulitis is when small pockets in your large intestine (colon) get infected or swollen.  Take medicines only as told by your doctor.  Follow a diet as told by your doctor. This information is  not intended to replace advice given to you by your health care provider. Make sure you discuss any questions you have with your health care provider. Document Released: 04/02/2008 Document Revised: 11/01/2016 Document Reviewed: 11/01/2016 Elsevier Interactive Patient Education  2019 [employer].

## 2018-12-19 ENCOUNTER — Encounter: Payer: Self-pay | Admitting: Family Medicine

## 2018-12-19 ENCOUNTER — Ambulatory Visit: Payer: 59 | Admitting: Family Medicine

## 2018-12-19 ENCOUNTER — Ambulatory Visit (INDEPENDENT_AMBULATORY_CARE_PROVIDER_SITE_OTHER): Payer: 59 | Admitting: Family Medicine

## 2018-12-19 VITALS — BP 122/76 | HR 86 | Temp 98.0°F | Ht 75.0 in | Wt 267.1 lb

## 2018-12-19 DIAGNOSIS — R911 Solitary pulmonary nodule: Secondary | ICD-10-CM | POA: Insufficient documentation

## 2018-12-19 DIAGNOSIS — R935 Abnormal findings on diagnostic imaging of other abdominal regions, including retroperitoneum: Secondary | ICD-10-CM | POA: Diagnosis not present

## 2018-12-19 DIAGNOSIS — K5792 Diverticulitis of intestine, part unspecified, without perforation or abscess without bleeding: Secondary | ICD-10-CM

## 2018-12-19 NOTE — Patient Instructions (Signed)
Good to see you today  Finish antibiotic  Can take Miralax over the counter (generic is fine) 1-2 doses a day to help move your bowels, can take 2 Tylenol every 8 to 12 hours for pain  If you develop fever/chills or worsening pain over the weekend, go to er  If symptoms not completely resolved in 1 week, follow up with Dr. Einar Pheasant  I have put in a referral to the gastroenterology office in [place]  I'm finding out about the lung nodule screening program to see if there is one in [place], if not you will need a CT of your chest in 1 year

## 2018-12-19 NOTE — Progress Notes (Signed)
Subjective:    Patient ID: Frank Stevens, male    DOB: [birth year], 43 y.o.   MRN: [id number]  HPI This is a 43 yo male who presents today for follow up of diverticulitis. Was seen 12/17/18 by Dr. Einar Pheasant, started on cipro/flagyl and had CT of abdomen and pelvis 12/17/18 that showed: findings suggestive of acute diverticulitis, punctate pulmonary nodules, suspected hepatic steatosis.  Today he reports feeling improved with pain decreased to 1/2. He is not running any fevers. Poor appetite. Good fluid intake today. Tolerating antibiotics.    Past Medical History:  Diagnosis Date  . Asthma    childhood  . GERD (gastroesophageal reflux disease)   . Hepatitis    age 20  . Pre-diabetes    Past Surgical History:  Procedure Laterality Date  . GANGLION CYST EXCISION Left   . lymph node     lymph node removed under left arm due to cat scratch fever. as a kid   Family History  Problem Relation Age of Onset  . Arthritis Mother   . COPD Mother   . Diabetes Mother   . Heart disease Mother   . Colon cancer Mother 29  . Congestive Heart Failure Mother   . Alcohol abuse Father   . Drug abuse Father   . Early death Father   . Kidney disease Father   . Cirrhosis Father   . Asthma Sister    Social History   Tobacco Use  . Smoking status: Current Every Day Smoker    Packs/day: 1.50    Years: 30.00    Pack years: 45.00    Types: Cigarettes  . Smokeless tobacco: Current User    Types: Chew  Substance Use Topics  . Alcohol use: Yes    Comment: 1-2 times per year  . Drug use: Not Currently      Review of Systems Per HPI    Objective:   Physical Exam Vitals signs reviewed.  Constitutional:      General: He is not in acute distress.    Appearance: Normal appearance. He is obese. He is not ill-appearing, toxic-appearing or diaphoretic.  HENT:     Head: Normocephalic and atraumatic.  Cardiovascular:     Rate and Rhythm: Normal rate and regular rhythm.     Heart sounds: Normal heart  sounds.  Pulmonary:     Effort: Pulmonary effort is normal.     Breath sounds: Normal breath sounds.  Abdominal:     General: Bowel sounds are normal. There is distension (mild).     Palpations: Abdomen is soft. There is no mass.     Tenderness: There is abdominal tenderness (LLQ). There is no guarding or rebound.     Hernia: No hernia is present.  Skin:    General: Skin is warm and dry.  Neurological:     Mental Status: He is alert and oriented to person, place, and time.  Psychiatric:        Mood and Affect: Mood normal.        Behavior: Behavior normal.        Thought Content: Thought content normal.        Judgment: Judgment normal.          BP 122/76 (BP Location: Left Arm, Patient Position: Sitting, Cuff Size: Normal)   Pulse 86   Temp 98 F (36.7 C) (Oral)   Ht 6\' 3"  (1.905 m)   Wt 267 lb 1.9 oz (121.2 kg)   SpO2 97%  BMI 33.39 kg/m  Wt Readings from Last 3 Encounters:  12/19/18 267 lb 1.9 oz (121.2 kg)  12/17/18 268 lb 8 oz (121.8 kg)  12/15/18 272 lb 8 oz (123.6 kg)    Assessment & Plan:  1. Abnormal CT of the abdomen - Discussed referral to GI for colonoscopy - Ambulatory referral to Gastroenterology  2. Diverticulitis - significantly improved, discussed adding Miralax, acetaminophen, good fluid intake, finish anitbiotic - RTC/ER precautions reviewed - Ambulatory referral to Gastroenterology  3. Incidental pulmonary nodule - will need follow up CT in 1 year due to smoking history, discussed this with patient  - he has follow up on file with PCP for December 30, 2018  Clarene Reamer, FNP-BC  [facility]  12/19/2018 11:22 AM

## 2018-12-26 ENCOUNTER — Encounter: Payer: Self-pay | Admitting: Gastroenterology

## 2018-12-30 ENCOUNTER — Encounter: Payer: Self-pay | Admitting: Family Medicine

## 2018-12-30 ENCOUNTER — Ambulatory Visit (INDEPENDENT_AMBULATORY_CARE_PROVIDER_SITE_OTHER): Payer: 59 | Admitting: Family Medicine

## 2018-12-30 VITALS — BP 110/78 | HR 77 | Temp 98.2°F | Ht 75.0 in | Wt 267.5 lb

## 2018-12-30 DIAGNOSIS — E669 Obesity, unspecified: Secondary | ICD-10-CM

## 2018-12-30 DIAGNOSIS — Z720 Tobacco use: Secondary | ICD-10-CM
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
## 2018-12-30 NOTE — Assessment & Plan Note (Signed)
Working on going back to [employer]. Long discussion about making healthy meal choices and hand out on mediterranean diet. Also talked about meal prepping so that he has quick breakfasts/lunches to take from home to avoid eating out.

## 2018-12-30 NOTE — Patient Instructions (Addendum)
Calorie counting > MyFitnessPal  Find ways to add vegetables to your diet Try to avoid eating out for most meals of the day Look for ways to reduce excess calories -- sugary drinks, salad dressing  DASH Eating Plan DASH stands for "Dietary Approaches to Stop Hypertension." The DASH eating plan is a healthy eating plan that has been shown to reduce high blood pressure (hypertension). It may also reduce your risk for type 2 diabetes, heart disease, and stroke. The DASH eating plan may also help with weight loss. What are tips for following this plan?  General guidelines  Avoid eating more than 2,300 mg (milligrams) of salt (sodium) a day. If you have hypertension, you may need to reduce your sodium intake to 1,500 mg a day.  Limit alcohol intake to no more than 1 drink a day for nonpregnant women and 2 drinks a day for men. One drink equals 12 oz of beer, 5 oz of wine, or 1 oz of hard liquor.  Work with your health care provider to maintain a healthy body weight or to lose weight. Ask what an ideal weight is for you.  Get at least 30 minutes of exercise that causes your heart to beat faster (aerobic exercise) most days of the week. Activities may include walking, swimming, or biking.  Work with your health care provider or diet and nutrition specialist (dietitian) to adjust your eating plan to your individual calorie needs. Reading food labels   Check food labels for the amount of sodium per serving. Choose foods with less than 5 percent of the Daily Value of sodium. Generally, foods with less than 300 mg of sodium per serving fit into this eating plan.  To find whole grains, look for the word "whole" as the first word in the ingredient list. Shopping  Buy products labeled as "low-sodium" or "no salt added."  Buy fresh foods. Avoid canned foods and premade or frozen meals. Cooking  Avoid adding salt when cooking. Use salt-free seasonings or herbs instead of table salt or sea salt.  Check with your health care provider or pharmacist before using salt substitutes.  Do not fry foods. Cook foods using healthy methods such as baking, boiling, grilling, and broiling instead.  Cook with heart-healthy oils, such as olive, canola, soybean, or sunflower oil. Meal planning  Eat a balanced diet that includes: ? 5 or more servings of fruits and vegetables each day. At each meal, try to fill half of your plate with fruits and vegetables. ? Up to 6-8 servings of whole grains each day. ? Less than 6 oz of lean meat, poultry, or fish each day. A 3-oz serving of meat is about the same size as a deck of cards. One egg equals 1 oz. ? 2 servings of low-fat dairy each day. ? A serving of beans 5 times each week. ? Heart-healthy fats. Healthy fats called Omega-3 fatty acids are found in foods such as flaxseeds and coldwater fish, like sardines, salmon, and mackerel.  Limit how much you eat of the following: ? Canned or prepackaged foods. ? Food that is high in trans fat, such as fried foods. ? Food that is high in saturated fat, such as fatty meat. ? Sweets, desserts, sugary drinks, and other foods with added sugar. ? Full-fat dairy products.  Do not salt foods before eating.  Try to eat at least 2 vegetarian meals each week.  Eat more home-cooked food and less restaurant, buffet, and fast food.  When eating at  a restaurant, ask that your food be prepared with less salt or no salt, if possible. What foods are recommended? The items listed may not be a complete list. Talk with your dietitian about what dietary choices are best for you. Grains Whole-grain or whole-wheat bread. Whole-grain or whole-wheat pasta. Brown rice. Frank Stevens. Bulgur. Whole-grain and low-sodium cereals. Pita bread. Low-fat, low-sodium crackers. Whole-wheat flour tortillas. Vegetables Fresh or frozen vegetables (raw, steamed, roasted, or grilled). Low-sodium or reduced-sodium tomato and vegetable juice.  Low-sodium or reduced-sodium tomato sauce and tomato paste. Low-sodium or reduced-sodium canned vegetables. Fruits All fresh, dried, or frozen fruit. Canned fruit in natural juice (without added sugar). Meat and other protein foods Skinless chicken or [place]. Ground chicken or [place]. Pork with fat trimmed off. Fish and seafood. Egg whites. Dried beans, peas, or lentils. Unsalted nuts, nut butters, and seeds. Unsalted canned beans. Lean cuts of beef with fat trimmed off. Low-sodium, lean deli meat. Dairy Low-fat (1%) or fat-free (skim) milk. Fat-free, low-fat, or reduced-fat cheeses. Nonfat, low-sodium ricotta or cottage cheese. Low-fat or nonfat yogurt. Low-fat, low-sodium cheese. Fats and oils Soft margarine without trans fats. Vegetable oil. Low-fat, reduced-fat, or light mayonnaise and salad dressings (reduced-sodium). Canola, safflower, olive, soybean, and sunflower oils. Avocado. Seasoning and other foods Herbs. Spices. Seasoning mixes without salt. Unsalted popcorn and pretzels. Fat-free sweets. What foods are not recommended? The items listed may not be a complete list. Talk with your dietitian about what dietary choices are best for you. Grains Baked goods made with fat, such as croissants, muffins, or some breads. Dry pasta or rice meal packs. Vegetables Creamed or fried vegetables. Vegetables in a cheese sauce. Regular canned vegetables (not low-sodium or reduced-sodium). Regular canned tomato sauce and paste (not low-sodium or reduced-sodium). Regular tomato and vegetable juice (not low-sodium or reduced-sodium). Frank Stevens. Olives. Fruits Canned fruit in a light or heavy syrup. Fried fruit. Fruit in cream or butter sauce. Meat and other protein foods Fatty cuts of meat. Ribs. Fried meat. Frank Stevens. Sausage. Bologna and other processed lunch meats. Salami. Fatback. Hotdogs. Bratwurst. Salted nuts and seeds. Canned beans with added salt. Canned or smoked fish. Whole eggs or egg yolks. Chicken  or [place] with skin. Dairy Whole or 2% milk, cream, and half-and-half. Whole or full-fat cream cheese. Whole-fat or sweetened yogurt. Full-fat cheese. Nondairy creamers. Whipped toppings. Processed cheese and cheese spreads. Fats and oils Butter. Stick margarine. Lard. Shortening. Ghee. Bacon fat. Tropical oils, such as coconut, palm kernel, or palm oil. Seasoning and other foods Salted popcorn and pretzels. Onion salt, garlic salt, seasoned salt, table salt, and sea salt. Worcestershire sauce. Tartar sauce. Barbecue sauce. Teriyaki sauce. Soy sauce, including reduced-sodium. Steak sauce. Canned and packaged gravies. Fish sauce. Oyster sauce. Cocktail sauce. Horseradish that you find on the shelf. Ketchup. Mustard. Meat flavorings and tenderizers. Bouillon cubes. Hot sauce and Tabasco sauce. Premade or packaged marinades. Premade or packaged taco seasonings. Relishes. Regular salad dressings. Where to find more information:  National Heart, Lung, and [place]: https://wilson-eaton.com/  American Heart Association: www.heart.org Summary  The DASH eating plan is a healthy eating plan that has been shown to reduce high blood pressure (hypertension). It may also reduce your risk for type 2 diabetes, heart disease, and stroke.  With the DASH eating plan, you should limit salt (sodium) intake to 2,300 mg a day. If you have hypertension, you may need to reduce your sodium intake to 1,500 mg a day.  When on the DASH eating plan, aim to eat  more fresh fruits and vegetables, whole grains, lean proteins, low-fat dairy, and heart-healthy fats.  Work with your health care provider or diet and nutrition specialist (dietitian) to adjust your eating plan to your individual calorie needs. This information is not intended to replace advice given to you by your health care provider. Make sure you discuss any questions you have with your health care provider. Document Released: 10/04/2011 Document Revised:  10/08/2016 Document Reviewed: 10/08/2016 Elsevier Interactive Patient Education  2019 [employer].

## 2018-12-30 NOTE — Progress Notes (Signed)
Subjective:     Frank Stevens is a 43 y.o. male presenting for 3 month follow up     HPI  #Tobacco  - patches did not stick to the arm - using the lozenges - cut back to nothing - then the lozenges started upsetting his stomach - down to 1 ppd now - failed chantax - getting winded  #Obesity - started going to the gym yesterday - currently in school for quality improvement degree - exercise intolerance - Breakfast: biscuit - Lunch: grilled chicken wrap - take out - Dinner: pizza (at school) - goal to get down to 230 lb -- so he can go sky diving   #shoulder - seeing the chiropractor today - better, but not back to normal      Review of Systems  Eyes: Negative for visual disturbance.  Respiratory: Positive for shortness of breath (with exercise). Negative for wheezing.   Cardiovascular: Negative for chest pain.    09/30/2018: Clinic - discussed tobacco cessation with patches 12/08/2018: Clinic - shoulder injury 12/17/2018: Clinic - diverticulitis > CT with lung nodule needs repeat in 1 year  Social History   Tobacco Use  Smoking Status Current Every Day Smoker  . Packs/day: 1.00  . Years: 30.00  . Pack years: 30.00  . Types: Cigarettes  Smokeless Tobacco Current User  . Types: Chew        Objective:    BP Readings from Last 3 Encounters:  12/30/18 110/78  12/19/18 122/76  12/17/18 122/60   Wt Readings from Last 3 Encounters:  12/30/18 267 lb 8 oz (121.3 kg)  12/19/18 267 lb 1.9 oz (121.2 kg)  12/17/18 268 lb 8 oz (121.8 kg)    BP 110/78   Pulse 77   Temp 98.2 F (36.8 C) (Oral)   Ht 6\' 3"  (1.905 m)   Wt 267 lb 8 oz (121.3 kg)   SpO2 94%   BMI 33.44 kg/m    Physical Exam Constitutional:      Appearance: Normal appearance. He is not ill-appearing or diaphoretic.  HENT:     Right Ear: External ear normal.     Left Ear: External ear normal.     Nose: Nose normal.  Eyes:     General: No scleral icterus.    Extraocular Movements:  Extraocular movements intact.     Conjunctiva/sclera: Conjunctivae normal.  Neck:     Musculoskeletal: Neck supple.  Cardiovascular:     Rate and Rhythm: Normal rate and regular rhythm.     Heart sounds: No murmur.  Pulmonary:     Effort: Pulmonary effort is normal. No respiratory distress.     Breath sounds: Normal breath sounds. No wheezing.  Skin:    General: Skin is warm and dry.  Neurological:     Mental Status: He is alert. Mental status is at baseline.  Psychiatric:        Mood and Affect: Mood normal.           Assessment & Plan:   Problem List Items Addressed This Visit      Other   Tobacco use    Patches were ineffective as would not stick on arm. Is using lozenges and cutting back from 1.5 ppd > 1ppd. Feels this is working and will continue to slowly cut back. Advised follow-up if hitting a road block and interested in wellbutrin trial.       Obesity (BMI 30-39.9) - Primary    Working on going back to the  gym. Long discussion about making healthy meal choices and hand out on mediterranean diet. Also talked about meal prepping so that he has quick breakfasts/lunches to take from home to avoid eating out.           Return if symptoms worsen or fail to improve.  Lesleigh Noe, MD

## 2019-01-01 ENCOUNTER — Ambulatory Visit: Payer: 59 | Admitting: Gastroenterology

## 2019-01-20 ENCOUNTER — Ambulatory Visit: Payer: Self-pay | Admitting: *Deleted

## 2019-01-20 ENCOUNTER — Telehealth: Payer: Self-pay | Admitting: Family Medicine

## 2019-01-20 NOTE — Telephone Encounter (Signed)
Patient said he called our office earlier and was told to go to the hospital for cough,sob, and dizziness.  Patient went to [facility]-.  Patient said they did troponin test and lab work, ekg, and chest x-ray.  Patient said they told him his clood pressure was high and it was 136/84 and didn't check him for flu or covid-19.  Patient said they took him out of work until 02/02/19.  Patient wants to know if he needs to stay out of work since they didn't say what was wrong with him.

## 2019-01-20 NOTE — Telephone Encounter (Signed)
Spoke with patient and let him know that we can see some of the ER visit information but does not look like the note has been finished all the way. Advised patient what diagnoses they gave him so far per report. Patient states he is not concerned about himself but more for his wife. He was told at the ER to be away from his wife but that is nearly impossible. Patient's wife has MS and Crohn's, also she has a history of going through Chemotherapy for MS. He just wants to know what to do more for his wife and recommendations. Advised patient I would check with Dr. Einar Pheasant to see if there is anything else that can be done or recommended to help patient and for peace of mind.

## 2019-01-20 NOTE — Telephone Encounter (Signed)
Called to speak with patient about wife (hx of immunosuppression, but not currently on treatment with MS)   Difficult to separate due to size of home.   Advised the following:  1) clean surfaces 2) try to sleep in separate areas if possible 3) Check out CDC for caring for yourself at home and caring for a loved one a home > both good resources to avoid infection 4) Advised pt to try and wear mask around wife  Also discussed more recent guidelines for being out of work 1) no fever x 3 days (explained he could still develop fever) 2) No symptoms 3) More than 7 days since symptoms started  Pt expressed understanding

## 2019-01-20 NOTE — Telephone Encounter (Signed)
Patient states he has started with cough, chest pain yesterday- some drainage-sore throat- no fever at this time.  Patient has been told stay away from work at this time. Advised patient due to chest tightness and respiratory symptoms he needs to be checked at ED - he agrees to go. He will call office to update Dr Einar Pheasant. Patient states he is going to [facility] facility close to his house.  Additional Information . Commented on: [1] Difficulty breathing occurs AND [2] within 14 days of COVID-19 EXPOSURE (Close Contact)    Patient has developed cough and chest tightness- no fever present. Due to respiratory symptoms patient advised ED - patient is concerned that he may have virus- discussed home care and reasons to be seen- he wants to be seen.  Answer Assessment - Initial Assessment Questions 1. RESPIRATORY STATUS: "Describe your breathing?" (e.g., wheezing, shortness of breath, unable to speak, severe coughing)      Some SOB Saturday night- not having it anymore- some chest tightness 2. ONSET: "When did this breathing problem begin?"      Saturday night 3. PATTERN "Does the difficult breathing come and go, or has it been constant since it started?"      Tightness in chest was worse last night with cough- constant now 4. SEVERITY: "How bad is your breathing?" (e.g., mild, moderate, severe)    - MILD: No SOB at rest, mild SOB with walking, speaks normally in sentences, can lay down, no retractions, pulse < 100.    - MODERATE: SOB at rest, SOB with minimal exertion and prefers to sit, cannot lie down flat, speaks in phrases, mild retractions, audible wheezing, pulse 100-120.    - SEVERE: Very SOB at rest, speaks in single words, struggling to breathe, sitting hunched forward, retractions, pulse > 120      mild 5. RECURRENT SYMPTOM: "Have you had difficulty breathing before?" If so, ask: "When was the last time?" and "What happened that time?"      No- patient is smoking 6. CARDIAC HISTORY: "Do you  have any history of heart disease?" (e.g., heart attack, angina, bypass surgery, angioplasty)      no 7. LUNG HISTORY: "Do you have any history of lung disease?"  (e.g., pulmonary embolus, asthma, emphysema)     Childhood asthma 8. CAUSE: "What do you think is causing the breathing problem?"      Possible exposure to virus 9. OTHER SYMPTOMS: "Do you have any other symptoms? (e.g., dizziness, runny nose, cough, chest pain, fever)     Cough, fogginess- not feeling good 10. PREGNANCY: "Is there any chance you are pregnant?" "When was your last menstrual period?"       n/a 11. TRAVEL: "Have you traveled out of the country in the last month?" (e.g., travel history, exposures)       No travel  Protocols used: CORONAVIRUS (COVID-19) EXPOSURE-A-AH, BREATHING DIFFICULTY-A-AH

## 2019-01-21 ENCOUNTER — Encounter: Payer: Self-pay | Admitting: Family Medicine

## 2019-01-22 ENCOUNTER — Other Ambulatory Visit: Payer: Self-pay

## 2019-01-22 ENCOUNTER — Ambulatory Visit (INDEPENDENT_AMBULATORY_CARE_PROVIDER_SITE_OTHER): Payer: 59 | Admitting: Gastroenterology

## 2019-01-22 DIAGNOSIS — K5732 Diverticulitis of large intestine without perforation or abscess without bleeding: Secondary | ICD-10-CM | POA: Diagnosis not present

## 2019-01-22 NOTE — Patient Instructions (Addendum)
High fiber diet recommended with fresh fruits, vegetables, and bran fibers.   Daily stool bulking agent like psyllium recommended.  Non-steroidal antiinflammatory medications (such as ibuprofen, naproxen, etc) may increase your risk for a recurrent of diverticulitis. Please avoid these medications whenever possible.   I recommend that you take a daily probiotic.  Congratulations on your smoking cessation. Keep up the good work.  Colonoscopy recommended as soon as the [place] restrictions have been lifted. We will contact you to schedule when we are able.   Please let me know if you have any new questions or concerns before your colonoscopy.   Thank you for your patience with me and our technology today! Please stay home, safe, and healthy. I look forward to meeting you in person in the near future.

## 2019-01-22 NOTE — Progress Notes (Signed)
TELEHEALTH VISIT  Referring Provider: Elby Beck, FNP Primary Care Physician:  Lesleigh Noe, MD   Tele-visit due to COVID-19 pandemic Patient requested visit virtually, consented to the encounter telephone call Patient seems aware of limitations, risks, security and privacy concerns of performing an evaluation and management service by telephone and the ongoing risk and availability of in-person appointments  Contact made at: 2:00pm 01/22/19 Patient verified by name and date of birth Location of patient: Home office Location provider: My home office Names of persons participating: Me, patient Time spent on call: 19 minutes   Reason for Consultation:  Recent diverticulitis   IMPRESSION:  Sigmoid diverticulitis 11/2018 with associated pneumoperitoneum Recent rectal bleeding that patient thinks may be hemorrhoids Family history of colon cancer (mother at 42) Tobacco habituation - quit smoking yesterday No prior colon cancer or screening Subpleural lung nodules - Dr. Einar Pheasant plans repeat CT in 1 year  New diagnosis of sigmoid diverticulitis 11/2018. Clinically improving after completing antibiotics.  Colonoscopy recommended to exclude other etiologies. Will plan evaluation after Covid19 restrictions are lifted. Reviewed recommendations to follow a high fiber diet or to use fiber supplements on a regular basis.  However, there is no need to avoid seeds, corn, berries, and nuts.Using NSAIDs may be associated with a moderately increased risk of occurrence of any episode of diverticulitis and complicated diverticulitis. Could consider surgical consultation for possible resection with recurrent symptoms.   The differential for rectal bleeding is broad.  It includes outlet sources such as fissure or hemorrhoids, as well as polyps, mass, ulcers, and colitis. This differential gives me another reason to recommend a colonoscopy.  PLAN: Colonoscopy in 4 weeks High fiber diet  recommended Avoid NSAIDs Daily stool bulking agent like psyllium recommended Encouraged for his smoking cessation  I consented the patient at the bedside today discussing the risks, benefits, and alternatives to endoscopic evaluation. In particular, we discussed the risks that include, but are not limited to, reaction to medication, cardiopulmonary compromise, bleeding requiring blood transfusion, aspiration resulting in pneumonia, perforation requiring surgery, lack of diagnosis, severe illness requiring hospitalization, and even death. We reviewed the risk of missed lesion including polyps or even cancer. The patient acknowledges these risks and asks that we proceed.  19 minutes spent with the patient today. Greater than 50% was spent in counseling and coordination of care with the patient   HPI: Frank Stevens is a 43 y.o. male who works in [employer] fund[employer]. He was referred for diverticulitis.  The history is obtained through the patient and review of his electronic health record.   Developed acute, severe, diffuse abdominal pain while at work. Worked the next day. Developed diaphoresis. Presented to ED. Diagnosed with diverticulitis 12/17/18 by CT. Treated with Cipro and Flagy.  "Felt like I was being stabbed." Pain has completely resolved. Now with mild alternating loose stools and constipation. Frequently will have a two bowel movements in the morning. Sense of complete evacuation. No tenesmus of abdominal cramping. No other associated symptoms. No identified exacerbating or relieving features.   History of rectal bleeding. He thinks it's related to hemorrhoids. Started around the same time. Raw. No rectal tearing. Associated itching and burning.  Preparation H provides some relief. Unable to feel a hemorrhoid.   Quit smoking today.   Mother with colon cancer at age 64.  No other known family history of colon cancer or polyps. No family history of uterine/endometrial cancer, pancreatic cancer or  gastric/stomach cancer. Wife with Crohn's disease and she  was followed by Dr. Olevia Perches.   CT of abd/pevis with contrast 12/17/18: subpleural lung nodules, suspected fatty liver, diverticulosis with sigmoid diverticulitis and adjacent pneumoperitoneum. No lymphadenopathy.  Past Medical History:  Diagnosis Date  . Asthma    childhood  . GERD (gastroesophageal reflux disease)   . Hepatitis    age 43  . Pre-diabetes   . Pulmonary nodule 12/17/2018   needs follow up CT in 1 year    Past Surgical History:  Procedure Laterality Date  . GANGLION CYST EXCISION Left   . lymph node     lymph node removed under left arm due to cat scratch fever. as a kid    Current Outpatient Medications  Medication Sig Dispense Refill  . ciprofloxacin (CIPRO) 500 MG tablet Take 1 tablet (500 mg total) by mouth 2 (two) times daily. 20 tablet 0   No current facility-administered medications for this visit.     Allergies as of 01/22/2019 - Review Complete 12/30/2018  Allergen Reaction Noted  . Penicillins  03/06/2016    Family History  Problem Relation Age of Onset  . Arthritis Mother   . COPD Mother   . Diabetes Mother   . Heart disease Mother   . Colon cancer Mother 18  . Congestive Heart Failure Mother   . Alcohol abuse Father   . Drug abuse Father   . Early death Father   . Kidney disease Father   . Cirrhosis Father   . Asthma Sister     Social History   Socioeconomic History  . Marital status: Married    Spouse name: Anderson Malta  . Number of children: 1  . Years of education: GED, associate degree  . Highest education level: Not on file  Occupational History  . Not on file  Social Needs  . Financial resource strain: Not hard at all  . Food insecurity:    Worry: Not on file    Inability: Not on file  . Transportation needs:    Medical: Not on file    Non-medical: Not on file  Tobacco Use  . Smoking status: Current Every Day Smoker    Packs/day: 1.00    Years: 30.00    Pack  years: 30.00    Types: Cigarettes  . Smokeless tobacco: Current User    Types: Chew  Substance and Sexual Activity  . Alcohol use: Yes    Comment: 1-2 times per year  . Drug use: Not Currently  . Sexual activity: Yes    Birth control/protection: Pill  Lifestyle  . Physical activity:    Days per week: Not on file    Minutes per session: Not on file  . Stress: Not on file  Relationships  . Social connections:    Talks on phone: Not on file    Gets together: Not on file    Attends religious service: Not on file    Active member of club or organization: Not on file    Attends meetings of clubs or organizations: Not on file    Relationship status: Not on file  . Intimate partner violence:    Fear of current or ex partner: Not on file    Emotionally abused: Not on file    Physically abused: Not on file    Forced sexual activity: Not on file  Other Topics Concern  . 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  Physical Exam: General: in no acute distress Neuro: Alert and appropriate Psych: Normal affect and normal insight   Reise Hietala L. Tarri Glenn, MD, MPH [facility] 01/22/2019, 1:59 PM

## 2019-01-30 ENCOUNTER — Ambulatory Visit: Payer: 59 | Admitting: Gastroenterology

## 2019-03-26 ENCOUNTER — Other Ambulatory Visit: Payer: Self-pay | Admitting: Emergency Medicine

## 2019-03-26 DIAGNOSIS — Z800 Family history of malignant neoplasm of digestive organs: Secondary | ICD-10-CM

## 2019-03-26 DIAGNOSIS — K5732 Diverticulitis of large intestine without perforation or abscess without bleeding: Secondary | ICD-10-CM

## 2019-03-26 DIAGNOSIS — K625 Hemorrhage of anus and rectum: Secondary | ICD-10-CM

## 2019-03-26 MED ORDER — NA SULFATE-K SULFATE-MG SULF 17.5-3.13-1.6 GM/177ML PO SOLN: 1.0000 | ORAL | 0 refills | Status: DC

## 2019-04-03 ENCOUNTER — Telehealth: Payer: Self-pay | Admitting: Gastroenterology

## 2019-04-03 NOTE — Telephone Encounter (Signed)
Left patient a message that his instructions are in mychart and also messaged him a copy of instructions. Prep sent to [facility].

## 2019-04-03 NOTE — Telephone Encounter (Signed)
Patient states he has a colonoscopy this Tuesday 6.9.20 and does not have a prep or instructions. Pt had ov on 3.26.20.

## 2019-04-06 ENCOUNTER — Telehealth: Payer: Self-pay | Admitting: *Deleted

## 2019-04-06 NOTE — Telephone Encounter (Signed)
Covid-19 screening questions  Have you traveled in the last 14 days? no If yes where?  Do you now or have you had a fever in the last 14 days? no  Do you have any respiratory symptoms of shortness of breath or cough now or in the last 14 days? no  Do you have any family members or close contacts with diagnosed or suspected Covid-19 in the past 14 days? no  Have you been tested for Covid-19 and found to be positive? no       

## 2019-04-06 NOTE — Telephone Encounter (Signed)
Patient returning phone call 

## 2019-04-06 NOTE — Telephone Encounter (Signed)
LM on VM to call back for screening questions.

## 2019-04-07 ENCOUNTER — Encounter: Payer: Self-pay | Admitting: Gastroenterology

## 2019-04-07 ENCOUNTER — Ambulatory Visit (AMBULATORY_SURGERY_CENTER): Payer: BLUE CROSS/BLUE SHIELD | Admitting: Gastroenterology

## 2019-04-07 ENCOUNTER — Encounter: Payer: 59 | Admitting: Gastroenterology

## 2019-04-07 ENCOUNTER — Other Ambulatory Visit: Payer: Self-pay

## 2019-04-07 VITALS — BP 100/55 | HR 60 | Temp 98.2°F | Resp 13 | Ht 75.0 in | Wt 267.0 lb

## 2019-04-07 DIAGNOSIS — D129 Benign neoplasm of anus and anal canal: Secondary | ICD-10-CM

## 2019-04-07 DIAGNOSIS — Z1211 Encounter for screening for malignant neoplasm of colon: Secondary | ICD-10-CM | POA: Diagnosis not present

## 2019-04-07 DIAGNOSIS — D122 Benign neoplasm of ascending colon: Secondary | ICD-10-CM

## 2019-04-07 DIAGNOSIS — K621 Rectal polyp: Secondary | ICD-10-CM | POA: Diagnosis not present

## 2019-04-07 DIAGNOSIS — K5732 Diverticulitis of large intestine without perforation or abscess without bleeding: Secondary | ICD-10-CM | POA: Diagnosis not present

## 2019-04-07 DIAGNOSIS — K635 Polyp of colon: Secondary | ICD-10-CM | POA: Diagnosis not present

## 2019-04-07 DIAGNOSIS — D128 Benign neoplasm of rectum: Secondary | ICD-10-CM

## 2019-04-07 DIAGNOSIS — D124 Benign neoplasm of descending colon: Secondary | ICD-10-CM

## 2019-04-07 MED ORDER — SODIUM CHLORIDE 0.9 % IV SOLN: 500.0000 mL | Freq: Once | INTRAVENOUS | Status: DC

## 2019-04-07 NOTE — Progress Notes (Signed)
Report given to PACU, vss 

## 2019-04-07 NOTE — Progress Notes (Signed)
Called to room to assist during endoscopic procedure.  Patient ID and intended procedure confirmed with present staff. Received instructions for my participation in the procedure from the performing physician.  

## 2019-04-07 NOTE — Op Note (Signed)
[facility] Patient Name: Frank Stevens Procedure Date: 04/07/2019 11:19 AM MRN: [id number] Endoscopist: Thornton Park MD, MD Age: 43 Referring MD:  Date of Birth: [birth year] Gender: Male Account #: [id number] Procedure:                Colonoscopy Indications:              Rectal bleeding, Follow-up of diverticulitis                           Sigmoid diverticulitis 11/2018 with associated                            pneumoperitoneum                           Recent rectal bleeding that patient thinks may be                            hemorrhoids                           Family history of colon cancer (mother at 46)                           Tobacco habituation - quit smoking yesterday                           No prior colon cancer or screening                           Subpleural lung nodules - Dr. Einar Pheasant plans repeat CT                            in 1 yearSigmoid Medicines:                See the Anesthesia note for documentation of the                            administered medications Procedure:                Pre-Anesthesia Assessment:                           - Prior to the procedure, a History and Physical                            was performed, and patient medications and                            allergies were reviewed. The patient's tolerance of                            previous anesthesia was also reviewed. The risks                            and benefits of the procedure and the sedation  options and risks were discussed with the patient.                            All questions were answered, and informed consent                            was obtained. Prior Anticoagulants: The patient has                            taken no previous anticoagulant or antiplatelet                            agents. ASA Grade Assessment: II - A patient with                            mild systemic disease. After reviewing the risks               and benefits, the patient was deemed in                            satisfactory condition to undergo the procedure.                           After obtaining informed consent, the colonoscope                            was passed under direct vision. Throughout the                            procedure, the patient's blood pressure, pulse, and                            oxygen saturations were monitored continuously. The                            Colonoscope was introduced through the anus and                            advanced to the the terminal ileum, with                            identification of the appendiceal orifice and IC                            valve. The colonoscopy was performed without                            difficulty. The patient tolerated the procedure                            well. The quality of the bowel preparation was                            good. The terminal ileum,  ileocecal valve,                            appendiceal orifice, and rectum were photographed. Scope In: 11:30:00 AM Scope Out: 11:48:26 AM Scope Withdrawal Time: 0 hours 11 minutes 58 seconds  Total Procedure Duration: 0 hours 18 minutes 26 seconds  Findings:                 The perianal and digital rectal examinations were                            normal.                           A 2 mm polyp was found in the ascending colon. The                            polyp was sessile. The polyp was removed with a                            cold biopsy forceps. Resection and retrieval were                            complete. Estimated blood loss was minimal.                           A 3 mm polyp was found in the ascending colon. The                            polyp was sessile. The polyp was removed with a                            cold snare. Resection and retrieval were complete.                            Estimated blood loss was minimal.                           A 2 mm polyp  was found in the descending colon. The                            polyp was flat. The polyp was removed with a cold                            snare. Resection and retrieval were complete.                            Estimated blood loss was minimal.                           A 3 mm polyp was found in the rectum. The polyp was                            sessile.  The polyp was removed with a cold snare.                            Resection and retrieval were complete.                           A few small-mouthed diverticula were found in the                            sigmoid colon and descending colon.                           The exam was otherwise without abnormality on                            direct and retroflexion views. Complications:            No immediate complications. Estimated blood loss:                            Minimal. Estimated Blood Loss:     Estimated blood loss was minimal. Impression:               - One 2 mm polyp in the ascending colon, removed                            with a cold biopsy forceps. Resected and retrieved.                           - One 3 mm polyp in the ascending colon, removed                            with a cold snare. Resected and retrieved.                           - One 2 mm polyp in the descending colon, removed                            with a cold snare. Resected and retrieved.                           - One 3 mm polyp in the rectum, removed with a cold                            snare. Resected and retrieved.                           - Mild diverticulosis in the sigmoid colon and in                            the descending colon.                           - The examination was otherwise normal on direct  and retroflexion views. Recommendation:           - Patient has a contact number available for                            emergencies. The signs and symptoms of potential                             delayed complications were discussed with the                            patient. Return to normal activities tomorrow.                            Written discharge instructions were provided to the                            patient.                           - Resume regular diet. High fiber diet recommended.                           - Continue present medications.                           - Await pathology results.                           - Repeat colonoscopy date to be determined after                            pending pathology results are reviewed for                            surveillance. Thornton Park MD, MD 04/07/2019 11:56:59 AM This report has been signed electronically.

## 2019-04-07 NOTE — Patient Instructions (Signed)
Please read handouts provided. Continue present medications. Await pathology results.        YOU HAD AN ENDOSCOPIC PROCEDURE TODAY AT THE [facility]:   Refer to the procedure report that was given to you for any specific questions about what was found during the examination.  If the procedure report does not answer your questions, please call your gastroenterologist to clarify.  If you requested that your care partner not be given the details of your procedure findings, then the procedure report has been included in a sealed envelope for you to review at your convenience later.  YOU SHOULD EXPECT: Some feelings of bloating in the abdomen. Passage of more gas than usual.  Walking can help get rid of the air that was put into your GI tract during the procedure and reduce the bloating. If you had a lower endoscopy (such as a colonoscopy or flexible sigmoidoscopy) you may notice spotting of blood in your stool or on the toilet paper. If you underwent a bowel prep for your procedure, you may not have a normal bowel movement for a few days.  Please Note:  You might notice some irritation and congestion in your nose or some drainage.  This is from the oxygen used during your procedure.  There is no need for concern and it should clear up in a day or so.  SYMPTOMS TO REPORT IMMEDIATELY:   Following lower endoscopy (colonoscopy or flexible sigmoidoscopy):  Excessive amounts of blood in the stool  Significant tenderness or worsening of abdominal pains  Swelling of the abdomen that is new, acute  Fever of 100F or higher    For urgent or emergent issues, a gastroenterologist can be reached at any hour by calling [phone].   DIET:  We do recommend a small meal at first, but then you may proceed to your regular diet.  Drink plenty of fluids but you should avoid alcoholic beverages for 24 hours.  ACTIVITY:  You should plan to take it easy for the rest of today and you should NOT  DRIVE or use heavy machinery until tomorrow (because of the sedation medicines used during the test).    FOLLOW UP: Our staff will call the number listed on your records 48-72 hours following your procedure to check on you and address any questions or concerns that you may have regarding the information given to you following your procedure. If we do not reach you, we will leave a message.  We will attempt to reach you two times.  During this call, we will ask if you have developed any symptoms of COVID 19. If you develop any symptoms (ie: fever, flu-like symptoms, shortness of breath, cough etc.) before then, please call [phone].  If you test positive for Covid 19 in the 2 weeks post procedure, please call and report this information to us.    If any biopsies were taken you will be contacted by phone or by letter within the next 1-3 weeks.  Please call us at [phone] if you have not heard about the biopsies in 3 weeks.    SIGNATURES/CONFIDENTIALITY: You and/or your care partner have signed paperwork which will be entered into your electronic medical record.  These signatures attest to the fact that that the information above on your After Visit Summary has been reviewed and is understood.  Full responsibility of the confidentiality of this discharge information lies with you and/or your care-partner. 

## 2019-04-07 NOTE — Progress Notes (Signed)
Pt's states no medical or surgical changes since previsit or office visit. 

## 2019-04-09 ENCOUNTER — Telehealth: Payer: Self-pay | Admitting: *Deleted

## 2019-04-09 NOTE — Telephone Encounter (Signed)
Second attempt, mailbox full and unable to leave message.

## 2019-04-09 NOTE — Telephone Encounter (Signed)
First attempt, left VM.  

## 2019-04-10 ENCOUNTER — Encounter: Payer: Self-pay | Admitting: Gastroenterology

## 2019-04-14 ENCOUNTER — Telehealth: Payer: Self-pay | Admitting: *Deleted

## 2019-04-14 NOTE — Telephone Encounter (Signed)
Pt requesting pathology results does not want to wait until letter comes in the mail.requesting to speak to the nurse. best number [phone]

## 2019-04-14 NOTE — Telephone Encounter (Signed)
Returning the patient's call:  Read the patient Dr. Tarri Glenn letter, nothing further at the time of the call.

## 2019-04-22 ENCOUNTER — Other Ambulatory Visit: Payer: Self-pay

## 2019-04-22 ENCOUNTER — Encounter: Payer: Self-pay | Admitting: Family Medicine

## 2019-04-22 ENCOUNTER — Ambulatory Visit (INDEPENDENT_AMBULATORY_CARE_PROVIDER_SITE_OTHER): Payer: BC Managed Care – PPO | Admitting: Family Medicine

## 2019-04-22 VITALS — BP 136/72 | HR 75 | Temp 98.2°F | Resp 18 | Ht 75.0 in | Wt 242.8 lb

## 2019-04-22 DIAGNOSIS — E669 Obesity, unspecified: Secondary | ICD-10-CM | POA: Diagnosis not present

## 2019-04-22 DIAGNOSIS — S61432A Puncture wound without foreign body of left hand, initial encounter: Secondary | ICD-10-CM

## 2019-04-22 DIAGNOSIS — S63501A Unspecified sprain of right wrist, initial encounter: Secondary | ICD-10-CM | POA: Diagnosis not present

## 2019-04-22 NOTE — Patient Instructions (Signed)
Wrist Sprain, Adult A wrist sprain is a stretch or tear in the strong, fibrous tissues (ligaments) that connect your wrist bones. There are three types of wrist sprains:  Grade 1. In this type of sprain, the ligament is stretched more than normal.  Grade 2. In this type of sprain, the ligament is partially torn. You may be able to move your wrist, but not very much.  Grade 3. In this type of sprain, the ligament or muscle is completely torn. You may find it difficult or extremely painful to move your wrist even a little. What are the causes? A wrist sprain can be caused by using the wrist too much during sports, exercise, or at work. It can also happen with a fall or during an accident. What increases the risk? This condition is more likely to occur in people:  With a previous wrist or arm injury.  With poor wrist strength and flexibility.  Who play contact sports, such as football or soccer.  Who play sports that may result in a fall, such as skateboarding, biking, skiing, or snowboarding.  Who do not exercise regularly.  Who use exercise equipment that does not fit well. What are the signs or symptoms? Symptoms of this condition include:  Pain in the wrist, arm, or hand.  Swelling or bruised skin near the wrist, hand, or arm. The skin may look yellow or kind of blue.  Stiffness or trouble moving the hand.  Hearing a pop or feeling a tear at the time of the injury.  A warm feeling in the skin around the wrist. How is this diagnosed? This condition is diagnosed with a physical exam. Sometimes an X-ray is taken to make sure a bone did not break. If your health care provider thinks that you tore a ligament, he or she may order an MRI of your wrist. How is this treated? This condition is treated by resting and applying ice to your wrist. Additional treatment may include:  Medicine for pain and inflammation.  A splint to keep your wrist still (immobilized).  Exercises to  strengthen and stretch your wrist.  Surgery. This may be done if the ligament is completely torn. Follow these instructions at home: If you have a splint:   Do not put pressure on any part of the splint until it is fully hardened. This may take several hours.  Wear the splint as told by your health care provider. Remove it only as told by your health care provider.  Loosen the splint if your fingers tingle, become numb, or turn cold and blue.  If your splint is not waterproof: ? Do not let it get wet. ? Cover it with a watertight covering when you take a bath or a shower.  Keep the splint clean. Managing pain, stiffness, and swelling   If directed, put ice on the injured area. ? If you have a removable splint, remove it as told by your health care provider. ? Put ice in a plastic bag. ? Place a towel between your skin and the bag or between the splint and the bag. ? Leave the ice on for 20 minutes, 2-3 times per day.  Move your fingers often to avoid stiffness and to lessen swelling.  Raise (elevate) the injured area above the level of your heart while you are sitting or lying down. Activity  Rest your wrist. Do not do things that cause pain.  Return to your normal activities as told by your health care provider.   Ask your health care provider what activities are safe for you.  Do exercises as told by your health care provider. General instructions  Take over-the-counter and prescription medicines only as told by your health care provider.  Do not use any products that contain nicotine or tobacco, such as cigarettes and e-cigarettes. These can delay healing. If you need help quitting, ask your health care provider.  Ask your health care provider when it is safe to drive if you have a splint.  Keep all follow-up visits as told by your health care provider. This is important. Contact a health care provider if:  Your pain, bruising, or swelling gets worse.  Your skin  becomes red, gets a rash, or has open sores.  Your pain does not get better or it gets worse. Get help right away if:  You have a new or sudden sharp pain in the hand, arm, or wrist.  You have tingling or numbness in your hand.  Your fingers turn white, very red, or cold and blue.  You cannot move your fingers. This information is not intended to replace advice given to you by your health care provider. Make sure you discuss any questions you have with your health care provider. Document Released: 06/18/2014 Document Revised: 05/12/2016 Document Reviewed: 05/03/2016 Elsevier Interactive Patient Education  2019 [employer]  

## 2019-04-22 NOTE — Progress Notes (Signed)
Subjective:     Frank Stevens is a 43 y.o. male presenting for Hand Injury (left hand. [place] of the porch at home and landed with his left hand on a nail. This happened on 04/20/2019 at 1 am.)     HPI  #Wrist pain - fell on his hands on Monday 04/20/2019 - puncture wound with a nail - has been bleeding since Monday - got a tetenus shot at work - sore on the left hand and pain in both wrists - right wrist pain is worse than the left - ulnar side of the wrist - worse with certain movements - has not been dropping anything - is getting pain with certain activities at work - taking ibuprofen w/ some help - no other hx of chronic wrist pain or injury  - nail wound is improved  Is improving with time and rest    Review of Systems  Constitutional: Negative for chills and fever.  Neurological: Negative for numbness.     Social History   Tobacco Use  Smoking Status Current Every Day Smoker  . Packs/day: 1.00  . Years: 30.00  . Pack years: 30.00  . Types: Cigarettes  Smokeless Tobacco Current User  . Types: Chew        Objective:    BP Readings from Last 3 Encounters:  04/22/19 136/72  04/07/19 (!) 100/55  12/30/18 110/78   Wt Readings from Last 3 Encounters:  04/22/19 242 lb 12 oz (110.1 kg)  04/07/19 267 lb (121.1 kg)  12/30/18 267 lb 8 oz (121.3 kg)    BP 136/72   Pulse 75   Temp 98.2 F (36.8 C)   Resp 18   Ht 6\' 3"  (1.905 m)   Wt 242 lb 12 oz (110.1 kg)   SpO2 96%   BMI 30.34 kg/m    Physical Exam Constitutional:      Appearance: Normal appearance. He is not ill-appearing or diaphoretic.  HENT:     Right Ear: External ear normal.     Left Ear: External ear normal.     Nose: Nose normal.  Eyes:     General: No scleral icterus.    Extraocular Movements: Extraocular movements intact.     Conjunctiva/sclera: Conjunctivae normal.  Neck:     Musculoskeletal: Neck supple.  Cardiovascular:     Rate and Rhythm: Normal rate.  Pulmonary:   Effort: Pulmonary effort is normal.  Musculoskeletal:     Comments: Left hand:  Inspection: puncture wound on the palm with routine healing noted, hemostatic with mild erythema Palpation: mild TTP along the wound, no pain along the wrist ROM: normal Strength: normal Right Hand: Inspection: mild swelling noted Palpation: TTP along the ulnar head and along some of the carpal bones and the fifth metacarpal area.  ROM: normal Strength: normal  Skin:    General: Skin is warm and dry.  Neurological:     Mental Status: He is alert. Mental status is at baseline.  Psychiatric:        Mood and Affect: Mood normal.           Assessment & Plan:   Problem List Items Addressed This Visit      Musculoskeletal and Integument   Sprain of right wrist - Primary     Other   Obesity (BMI 30-39.9)    Doing great with weight loss. Encouraged and congrats to patient       Other Visit Diagnoses    Puncture wound of left  hand without foreign body, initial encounter         Discussed that given improvement likely sprain. Advised rest and slowly doing strengthening. Could wear brace as protection for things that are more active. Return if symptoms continuing or worsening as may want to consider XR imaging, though lower suspicion for fracture.   Puncture wound healing, already received TD vaccine. Discussed signs of infection to call about.   Return if symptoms worsen or fail to improve.  Lesleigh Noe, MD

## 2019-04-22 NOTE — Assessment & Plan Note (Signed)
Doing great with weight loss. Encouraged and congrats to patient

## 2019-05-30 DIAGNOSIS — Z1159 Encounter for screening for other viral diseases: Secondary | ICD-10-CM | POA: Diagnosis not present

## 2019-08-24 ENCOUNTER — Encounter: Payer: Self-pay | Admitting: Family Medicine

## 2019-08-24 DIAGNOSIS — A6000 Herpesviral infection of urogenital system, unspecified: Secondary | ICD-10-CM

## 2019-08-25 MED ORDER — ACYCLOVIR 800 MG PO TABS: 800.0000 mg | ORAL_TABLET | Freq: Three times a day (TID) | ORAL | 1 refills | Status: DC

## 2019-08-25 MED ORDER — ACYCLOVIR 800 MG PO TABS: 800.0000 mg | ORAL_TABLET | Freq: Three times a day (TID) | ORAL | 1 refills | Status: AC

## 2019-08-25 NOTE — Addendum Note (Signed)
Addended by: Lesleigh Noe on: 08/25/2019 10:24 AM   Modules accepted: Orders

## 2019-08-25 NOTE — Addendum Note (Signed)
Addended by: Kris Mouton on: 08/25/2019 12:23 PM   Modules accepted: Orders

## 2019-08-25 NOTE — Telephone Encounter (Signed)
Spoke with patient. He was started on this medication for genital herpes a few years back by a Nurse Practitioner at work. He was suppose to take it daily but was not. He now has a bump there possibly and understands why he was told to take it daily. Pharmacy did not have a record of mg or directions on file because it was longer than 2 years (that's how far back their system goes). Does patient need an appointment?

## 2019-08-31 ENCOUNTER — Ambulatory Visit (INDEPENDENT_AMBULATORY_CARE_PROVIDER_SITE_OTHER): Payer: BC Managed Care – PPO | Admitting: Family Medicine

## 2019-08-31 ENCOUNTER — Other Ambulatory Visit: Payer: Self-pay

## 2019-08-31 VITALS — BP 128/86 | HR 78 | Temp 99.2°F | Ht 75.0 in | Wt 238.5 lb

## 2019-08-31 DIAGNOSIS — S99921A Unspecified injury of right foot, initial encounter: Secondary | ICD-10-CM | POA: Diagnosis not present

## 2019-08-31 DIAGNOSIS — B009 Herpesviral infection, unspecified: Secondary | ICD-10-CM

## 2019-08-31 DIAGNOSIS — Z113 Encounter for screening for infections with a predominantly sexual mode of transmission: Secondary | ICD-10-CM | POA: Diagnosis not present

## 2019-08-31 DIAGNOSIS — A6001 Herpesviral infection of penis: Secondary | ICD-10-CM

## 2019-08-31 MED ORDER — VALACYCLOVIR HCL 1 G PO TABS: 1000.0000 mg | ORAL_TABLET | Freq: Every day | ORAL | 0 refills | Status: AC

## 2019-08-31 MED ORDER — VALACYCLOVIR HCL 500 MG PO TABS: 500.0000 mg | ORAL_TABLET | Freq: Every day | ORAL | 3 refills | Status: DC

## 2019-08-31 NOTE — Assessment & Plan Note (Signed)
Exam and hx seem consistent with herpes though no active vesicles. Will do another course of treatment. If no improvement will refer to urology for evaluation. STI testing today and suppression therapy to start after treatment.

## 2019-08-31 NOTE — Patient Instructions (Signed)
#  Herpes -- take 1000 mg daily x 5 days - then start taking 500 mg daily

## 2019-08-31 NOTE — Progress Notes (Signed)
Subjective:     Frank Stevens is a 43 y.o. male presenting for Rash (herpes break out on penis. better since taking medication. some pain and itching present.)     HPI   #Herpes - herpes outbreak on his penis - thinks he only got 5/6 pills - started to get better on the medication but got worsen when he finished the medication - hx of being on suppression therapy - has been 2-3 years - endorses recent intercourse - just 1 new partner - would like STI testing - no discharge - looked fluid filled - initially but this has resolved - some pain below   #Toe pain - occurred on last sunday - was able to walk and worked all week - getting better  Review of Systems  Constitutional: Negative for chills and fever.  Genitourinary: Positive for genital sores. Negative for discharge, penile swelling, scrotal swelling and testicular pain.  Musculoskeletal:       Foot pain     Social History   Tobacco Use  Smoking Status Current Every Day Smoker  . Packs/day: 1.00  . Years: 30.00  . Pack years: 30.00  . Types: Cigarettes  Smokeless Tobacco Current User  . Types: Chew        Objective:    BP Readings from Last 3 Encounters:  08/31/19 128/86  04/22/19 136/72  04/07/19 (!) 100/55   Wt Readings from Last 3 Encounters:  08/31/19 238 lb 8 oz (108.2 kg)  04/22/19 242 lb 12 oz (110.1 kg)  04/07/19 267 lb (121.1 kg)    BP 128/86   Pulse 78   Temp 99.2 F (37.3 C)   Ht 6\' 3"  (1.905 m)   Wt 238 lb 8 oz (108.2 kg)   SpO2 97%   BMI 29.81 kg/m    Physical Exam Exam conducted with a chaperone present.  Constitutional:      Appearance: Normal appearance. He is not ill-appearing or diaphoretic.  HENT:     Right Ear: External ear normal.     Left Ear: External ear normal.     Nose: Nose normal.  Eyes:     General: No scleral icterus.    Extraocular Movements: Extraocular movements intact.     Conjunctiva/sclera: Conjunctivae normal.  Neck:     Musculoskeletal:  Neck supple.  Cardiovascular:     Rate and Rhythm: Normal rate.  Pulmonary:     Effort: Pulmonary effort is normal.  Genitourinary:    Pubic Area: No rash.      Penis: Lesions (underside of shaft of penis with erythematous ulcer present) present.   Musculoskeletal:     Comments: Right Foot:  Inspection: no swelling or erythema or brusing Palpation: mild tenderness over the 5th metatarsal but no localized bony TTP ROM: normal  Skin:    General: Skin is warm and dry.  Neurological:     Mental Status: He is alert. Mental status is at baseline.  Psychiatric:        Mood and Affect: Mood normal.           Assessment & Plan:   Problem List Items Addressed This Visit      Other   Recurrent herpes simplex    Exam and hx seem consistent with herpes though no active vesicles. Will do another course of treatment. If no improvement will refer to urology for evaluation. STI testing today and suppression therapy to start after treatment.       Relevant Medications  valACYclovir (VALTREX) 1000 MG tablet   valACYclovir (VALTREX) 500 MG tablet   Injury of toe on right foot    Suspect sprain given improvement and no localized tenderness. Told him to reach out if not continuing to improve given location and concern for difficult healing. Defer XR.        Other Visit Diagnoses    Routine screening for STI (sexually transmitted infection)    -  Primary   Relevant Orders   HIV Antibody (routine testing w rflx)   RPR   C. trachomatis/N. gonorrhoeae RNA   Herpes simplex infection of penis       Relevant Medications   valACYclovir (VALTREX) 1000 MG tablet   valACYclovir (VALTREX) 500 MG tablet       Return if symptoms worsen or fail to improve.  Lesleigh Noe, MD

## 2019-08-31 NOTE — Assessment & Plan Note (Signed)
Suspect sprain given improvement and no localized tenderness. Told him to reach out if not continuing to improve given location and concern for difficult healing. Defer XR.

## 2019-09-01 ENCOUNTER — Encounter: Payer: Self-pay | Admitting: Family Medicine

## 2019-09-01 LAB — C. TRACHOMATIS/N. GONORRHOEAE RNA
C. trachomatis RNA, TMA: NOT DETECTED
N. gonorrhoeae RNA, TMA: NOT DETECTED

## 2019-09-01 LAB — RPR: RPR Ser Ql: NONREACTIVE

## 2019-09-01 LAB — HIV ANTIBODY (ROUTINE TESTING W REFLEX): HIV 1&2 Ab, 4th Generation: NONREACTIVE

## 2019-11-02 ENCOUNTER — Emergency Department: Payer: BC Managed Care – PPO

## 2019-11-02 ENCOUNTER — Other Ambulatory Visit: Payer: Self-pay

## 2019-11-02 ENCOUNTER — Emergency Department
Admission: EM | Admit: 2019-11-02 | Discharge: 2019-11-02 | Disposition: A | Discharge status: Home / Self Care | Payer: BC Managed Care – PPO | Attending: Student in an Organized Health Care Education/Training Program | Admitting: Student in an Organized Health Care Education/Training Program

## 2019-11-02 DIAGNOSIS — Z20822 Contact with and (suspected) exposure to covid-19: Secondary | ICD-10-CM | POA: Diagnosis not present

## 2019-11-02 DIAGNOSIS — R0789 Other chest pain: Secondary | ICD-10-CM

## 2019-11-02 DIAGNOSIS — J45909 Unspecified asthma, uncomplicated: Secondary | ICD-10-CM | POA: Diagnosis not present

## 2019-11-02 DIAGNOSIS — Z79899 Other long term (current) drug therapy: Secondary | ICD-10-CM | POA: Insufficient documentation

## 2019-11-02 DIAGNOSIS — F17210 Nicotine dependence, cigarettes, uncomplicated: Secondary | ICD-10-CM | POA: Insufficient documentation

## 2019-11-02 DIAGNOSIS — R079 Chest pain, unspecified: Secondary | ICD-10-CM | POA: Diagnosis not present

## 2019-11-02 DIAGNOSIS — M25512 Pain in left shoulder: Secondary | ICD-10-CM | POA: Diagnosis not present

## 2019-11-02 LAB — HEPATIC FUNCTION PANEL
ALT: 20 U/L (ref 0–44)
AST: 14 U/L — ABNORMAL LOW (ref 15–41)
Albumin: 4.1 g/dL (ref 3.5–5.0)
Alkaline Phosphatase: 53 U/L (ref 38–126)
Bilirubin, Direct: <0.1 mg/dL (ref 0.0–0.2)
Total Bilirubin: 0.5 mg/dL (ref 0.3–1.2)
Total Protein: 7.5 g/dL (ref 6.5–8.1)

## 2019-11-02 LAB — BASIC METABOLIC PANEL
Anion gap: 10 (ref 5–15)
BUN: 17 mg/dL (ref 6–20)
CO2: 24 mmol/L (ref 22–32)
Calcium: 9 mg/dL (ref 8.9–10.3)
Chloride: 103 mmol/L (ref 98–111)
Creatinine, Ser: 0.89 mg/dL (ref 0.61–1.24)
GFR calc Af Amer: >60 mL/min (ref >60)
GFR calc non Af Amer: >60 mL/min (ref >60)
Glucose, Bld: 106 mg/dL — ABNORMAL HIGH (ref 70–99)
Potassium: 4.6 mmol/L (ref 3.5–5.1)
Sodium: 137 mmol/L (ref 135–145)

## 2019-11-02 LAB — CBC
HCT: 50.5 % (ref 39.0–52.0)
Hemoglobin: 17.4 g/dL — ABNORMAL HIGH (ref 13.0–17.0)
MCH: 32.6 pg (ref 26.0–34.0)
MCHC: 34.5 g/dL (ref 30.0–36.0)
MCV: 94.7 fL (ref 80.0–100.0)
Platelets: 390 10*3/uL (ref 150–400)
RBC: 5.33 MIL/uL (ref 4.22–5.81)
RDW: 14.4 % (ref 11.5–15.5)
WBC: 15.8 10*3/uL — ABNORMAL HIGH (ref 4.0–10.5)
nRBC: 0 % (ref 0.0–0.2)

## 2019-11-02 LAB — TROPONIN I (HIGH SENSITIVITY)
Troponin I (High Sensitivity): 2 ng/L (ref <18)
Troponin I (High Sensitivity): 3 ng/L (ref <18)

## 2019-11-02 LAB — LIPASE, BLOOD: Lipase: 25 U/L (ref 11–51)

## 2019-11-02 MED ORDER — DOXYCYCLINE HYCLATE 100 MG PO TABS: 100.0000 mg | ORAL_TABLET | Freq: Two times a day (BID) | ORAL | 0 refills | Status: AC

## 2019-11-02 MED ORDER — SODIUM CHLORIDE 0.9% FLUSH: 3.0000 mL | Freq: Once | INTRAVENOUS | Status: DC

## 2019-11-02 MED ORDER — IOHEXOL 350 MG/ML SOLN
75.0000 mL | Freq: Once | INTRAVENOUS | Status: AC | PRN
  Administered 2019-11-02: 75 mL via INTRAVENOUS
  Filled 2019-11-02: qty 75

## 2019-11-02 MED ORDER — HYDROCODONE-ACETAMINOPHEN 5-325 MG PO TABS
1.0000 | ORAL_TABLET | Freq: Once | ORAL | Status: AC
  Administered 2019-11-02: 1 via ORAL
  Filled 2019-11-02: qty 1

## 2019-11-02 MED ORDER — ALBUTEROL SULFATE HFA 108 (90 BASE) MCG/ACT IN AERS: 2.0000 | INHALATION_SPRAY | Freq: Four times a day (QID) | RESPIRATORY_TRACT | 1 refills | Status: DC | PRN

## 2019-11-02 MED ORDER — SODIUM CHLORIDE 0.9 % IV BOLUS
500.0000 mL | Freq: Once | INTRAVENOUS | Status: AC
  Administered 2019-11-02: 500 mL via INTRAVENOUS

## 2019-11-02 NOTE — ED Notes (Signed)
See triage noted, pt states he is having dull chest pain to the center left that started this morning approx 10 AM that radiates to his left arm. Reports headache, dizziness. Denies N/V. PT in NAD at this time

## 2019-11-02 NOTE — ED Notes (Signed)
Pt transported to CT ?

## 2019-11-02 NOTE — ED Triage Notes (Signed)
Pt states he was at work today and started having chest pain with tingling in the left arm that started around 10am. Denies N/V/Diaphoresis. Pt is in NAD at present.

## 2019-11-02 NOTE — ED Provider Notes (Signed)
[facility] Emergency Department Provider Note    First MD Initiated Contact with Patient 11/02/19 1414     (approximate)  I have reviewed the triage vital signs and the nursing notes.   HISTORY  Chief Complaint Chest Pain    HPI Pavel Pepitone is a 44 y.o. male close past medical history as well as 1/2 pack/day smoking history presents the ER for evaluation left shoulder pain radiating down his left arm midsternal chest pain is worsened with exertion.  Denies any trauma.  Not reproducible.  Denies any diaphoresis with this pain.  No fevers.  No congestion but has had some cough.  No chills.  States he is having minimal discomfort right now currently rates it is a 3 out of 10.  He had cardiac work-up within the past 10 years was negative.    Past Medical History:  Diagnosis Date  . Asthma    childhood  . GERD (gastroesophageal reflux disease)   . Hepatitis    age 68  . Pre-diabetes   . Pulmonary nodule 12/17/2018   needs follow up CT in 1 year   Family History  Problem Relation Age of Onset  . Arthritis Mother   . COPD Mother   . Diabetes Mother   . Heart disease Mother   . Colon cancer Mother 65  . Congestive Heart Failure Mother   . Alcohol abuse Father   . Drug abuse Father   . Early death Father   . Kidney disease Father   . Cirrhosis Father   . Asthma Sister    Past Surgical History:  Procedure Laterality Date  . GANGLION CYST EXCISION Left   . lymph node     lymph node removed under left arm due to cat scratch fever. as a kid   Patient Active Problem List   Diagnosis Date Noted  . Recurrent herpes simplex 08/31/2019  . Injury of toe on right foot 08/31/2019  . Sprain of right wrist 04/22/2019  . Incidental pulmonary nodule 12/19/2018  . Tear of right supraspinatus tendon 12/15/2018  . Tobacco use 09/30/2018  . Obesity (BMI 30-39.9) 09/30/2018      Prior to Admission medications   Medication Sig Start Date End Date Taking?  Authorizing Provider  albuterol (VENTOLIN HFA) 108 (90 Base) MCG/ACT inhaler Inhale 2 puffs into the lungs every 6 (six) hours as needed for wheezing or shortness of breath. 11/02/19   Merlyn Lot, MD  doxycycline (VIBRA-TABS) 100 MG tablet Take 1 tablet (100 mg total) by mouth 2 (two) times daily for 7 days. 11/02/19 11/09/19  Merlyn Lot, MD  valACYclovir (VALTREX) 500 MG tablet Take 1 tablet (500 mg total) by mouth daily. 08/31/19   Lesleigh Noe, MD    Allergies Penicillins    Social History Social History   Tobacco Use  . Smoking status: Current Every Day Smoker    Packs/day: 1.00    Years: 30.00    Pack years: 30.00    Types: Cigarettes  . Smokeless tobacco: Current User    Types: Chew  Substance Use Topics  . Alcohol use: Yes    Comment: 1-2 times per year  . Drug use: Not Currently    Review of Systems Patient denies headaches, rhinorrhea, blurry vision, numbness, shortness of breath, chest pain, edema, cough, abdominal pain, nausea, vomiting, diarrhea, dysuria, fevers, rashes or hallucinations unless otherwise stated above in HPI. ____________________________________________   PHYSICAL EXAM:  VITAL SIGNS: Vitals:   11/02/19 1223  BP: (!) 152/99  Pulse: 85  Resp: 18  Temp: 97.7 F (36.5 C)  SpO2: 100%    Constitutional: Alert and oriented.  Eyes: Conjunctivae are normal.  Head: Atraumatic. Nose: No congestion/rhinnorhea. Mouth/Throat: Mucous membranes are moist.   Neck: No stridor. Painless ROM.  Cardiovascular: Normal rate, regular rhythm. Grossly normal heart sounds.  Good peripheral circulation. Respiratory: Normal respiratory effort.  No retractions. Lungs CTAB. Gastrointestinal: Soft and nontender. No distention. No abdominal bruits. No CVA tenderness. Genitourinary:  Musculoskeletal: No lower extremity tenderness nor edema.  No joint effusions. Neurologic:  Normal speech and language. No gross focal neurologic deficits are appreciated. No  facial droop Skin:  Skin is warm, dry and intact. No rash noted. Psychiatric: Mood and affect are normal. Speech and behavior are normal.  ____________________________________________   LABS (all labs ordered are listed, but only abnormal results are displayed)  Results for orders placed or performed during the hospital encounter of 11/02/19 (from the past 24 hour(s))  Basic metabolic panel     Status: Abnormal   Collection Time: 11/02/19 12:39 PM  Result Value Ref Range   Sodium 137 135 - 145 mmol/L   Potassium 4.6 3.5 - 5.1 mmol/L   Chloride 103 98 - 111 mmol/L   CO2 24 22 - 32 mmol/L   Glucose, Bld 106 (H) 70 - 99 mg/dL   BUN 17 6 - 20 mg/dL   Creatinine, Ser 0.89 0.61 - 1.24 mg/dL   Calcium 9.0 8.9 - 10.3 mg/dL   GFR calc non Af Amer >60 >60 mL/min   GFR calc Af Amer >60 >60 mL/min   Anion gap 10 5 - 15  CBC     Status: Abnormal   Collection Time: 11/02/19 12:39 PM  Result Value Ref Range   WBC 15.8 (H) 4.0 - 10.5 K/uL   RBC 5.33 4.22 - 5.81 MIL/uL   Hemoglobin 17.4 (H) 13.0 - 17.0 g/dL   HCT 50.5 39.0 - 52.0 %   MCV 94.7 80.0 - 100.0 fL   MCH 32.6 26.0 - 34.0 pg   MCHC 34.5 30.0 - 36.0 g/dL   RDW 14.4 11.5 - 15.5 %   Platelets 390 150 - 400 K/uL   nRBC 0.0 0.0 - 0.2 %  Troponin I (High Sensitivity)     Status: None   Collection Time: 11/02/19 12:39 PM  Result Value Ref Range   Troponin I (High Sensitivity) 2 <18 ng/L  Troponin I (High Sensitivity)     Status: None   Collection Time: 11/02/19  2:47 PM  Result Value Ref Range   Troponin I (High Sensitivity) 3 <18 ng/L  Hepatic function panel     Status: Abnormal   Collection Time: 11/02/19  2:47 PM  Result Value Ref Range   Total Protein 7.5 6.5 - 8.1 g/dL   Albumin 4.1 3.5 - 5.0 g/dL   AST 14 (L) 15 - 41 U/L   ALT 20 0 - 44 U/L   Alkaline Phosphatase 53 38 - 126 U/L   Total Bilirubin 0.5 0.3 - 1.2 mg/dL   Bilirubin, Direct <0.1 0.0 - 0.2 mg/dL   Indirect Bilirubin NOT CALCULATED 0.3 - 0.9 mg/dL  Lipase,  blood     Status: None   Collection Time: 11/02/19  2:47 PM  Result Value Ref Range   Lipase 25 11 - 51 U/L   ____________________________________________  EKG My review and personal interpretation at Time: 12:18   Indication: chest pain  Rate: 75  Rhythm:  sinus Axis: normal Other: normal intervals, no stemi ____________________________________________  RADIOLOGY  I personally reviewed all radiographic images ordered to evaluate for the above acute complaints and reviewed radiology reports and findings.  These findings were personally discussed with the patient.  Please see medical record for radiology report.  ____________________________________________   PROCEDURES  Procedure(s) performed:  Procedures    Critical Care performed: no ____________________________________________   INITIAL IMPRESSION / ASSESSMENT AND PLAN / ED COURSE  Pertinent labs & imaging results that were available during my care of the patient were reviewed by me and considered in my medical decision making (see chart for details).   DDX: ACS, pericarditis, esophagitis, boerhaaves, pe, dissection, pna, bronchitis, costochondritis   Chais Berger is a 44 y.o. who presents to the ED with symptoms as described above.  Patient currently well-appearing mildly hypertensive and describing pain rating through to his posterior shoulder and down his arm.  EKG with no evidence of acute ischemia initial troponin negative.  Patient's heart score of 3.  Will observe for serial enzymes.  Is low risk by Wells criteria and is PERC negative however given his description of pain will order CT angiogram to exclude dissection.  His abdominal exam is soft and benign.  Clinical Course as of Nov 01 1556  Mon Nov 02, 2019  1554 CT angiogram reassuring.  Patient has had some congestion given his smoking history we will treat for bronchitis.  Will test for Covid but I will see any indication for hospitalization at this time.   Repeat abdominal exam is soft and benign.  Patient stable appropriate for outpatient follow-up.   [PR]    Clinical Course User Index [PR] Merlyn Lot, MD    The patient was evaluated in Emergency Department today for the symptoms described in the history of present illness. He/she was evaluated in the context of the global COVID-19 pandemic, which necessitated consideration that the patient might be at risk for infection with the SARS-CoV-2 virus that causes COVID-19. Institutional protocols and algorithms that pertain to the evaluation of patients at risk for COVID-19 are in a state of rapid change based on information released by regulatory bodies including the CDC and federal and state organizations. These policies and algorithms were followed during the patient's care in the ED.  As part of my medical decision making, I reviewed the following data within the [place] notes reviewed and incorporated, Labs reviewed, notes from prior ED visits and [place] Controlled Substance Database   ____________________________________________   FINAL CLINICAL IMPRESSION(S) / ED DIAGNOSES  Final diagnoses:  Atypical chest pain      NEW MEDICATIONS STARTED DURING THIS VISIT:  New Prescriptions   ALBUTEROL (VENTOLIN HFA) 108 (90 BASE) MCG/ACT INHALER    Inhale 2 puffs into the lungs every 6 (six) hours as needed for wheezing or shortness of breath.   DOXYCYCLINE (VIBRA-TABS) 100 MG TABLET    Take 1 tablet (100 mg total) by mouth 2 (two) times daily for 7 days.     Note:  This document was prepared using Dragon voice recognition software and may include unintentional dictation errors.    Merlyn Lot, MD 11/02/19 1558

## 2019-11-04 ENCOUNTER — Telehealth: Payer: Self-pay

## 2019-11-04 LAB — NOVEL CORONAVIRUS, NAA (HOSP ORDER, SEND-OUT TO REF LAB; TAT 18-24 HRS): SARS-CoV-2, NAA: NOT DETECTED

## 2019-11-04 NOTE — Telephone Encounter (Signed)
[facility] Night - Client Nonclinical Telephone Record AccessNurse Client [facility] Night - Client Client Site [place] Physician Waunita Schooner- MD Contact Type Call Who Is Calling Patient / Member / Family / Caregiver Caller Name Merwin Phone Number [phone] Patient Name Frank Stevens Patient DOB [birth year] Call Type Message Only Information Provided Reason for Call Request to Schedule Office Appointment Initial Comment Caller states he was in the hospital on Monday, and needing a call back to schedule an appt for a follow up. Additional Comment Provided information for a call back from the office. Disp. Time Disposition Final User 11/04/2019 7:22:01 AM General Information Provided Yes Rosana Fret Call Closed By: Rosana Fret Transaction Date/Time: 11/04/2019 7:20:34 AM (ET)

## 2019-11-06 ENCOUNTER — Other Ambulatory Visit: Payer: Self-pay

## 2019-11-06 ENCOUNTER — Ambulatory Visit (INDEPENDENT_AMBULATORY_CARE_PROVIDER_SITE_OTHER): Payer: BC Managed Care – PPO | Admitting: Family Medicine

## 2019-11-06 ENCOUNTER — Encounter: Payer: Self-pay | Admitting: Family Medicine

## 2019-11-06 VITALS — BP 140/80 | HR 87 | Temp 98.7°F | Ht 75.0 in | Wt 250.8 lb

## 2019-11-06 DIAGNOSIS — R0789 Other chest pain: Secondary | ICD-10-CM | POA: Diagnosis not present

## 2019-11-06 DIAGNOSIS — Z720 Tobacco use: Secondary | ICD-10-CM | POA: Diagnosis not present

## 2019-11-06 DIAGNOSIS — E669 Obesity, unspecified: Secondary | ICD-10-CM | POA: Diagnosis not present

## 2019-11-06 DIAGNOSIS — Z113 Encounter for screening for infections with a predominantly sexual mode of transmission: Secondary | ICD-10-CM | POA: Diagnosis not present

## 2019-11-06 DIAGNOSIS — R911 Solitary pulmonary nodule: Secondary | ICD-10-CM

## 2019-11-06 DIAGNOSIS — N489 Disorder of penis, unspecified: Secondary | ICD-10-CM

## 2019-11-06 NOTE — Patient Instructions (Signed)

## 2019-11-06 NOTE — Progress Notes (Signed)
Subjective:    Patient ID: Frank Stevens, male    DOB: 1976/08/18, 44 y.o.   MRN: [id number]  HPI Chief Complaint  Patient presents with  . Follow-up    Pt requesting a referral to Cardiology. Pt using Albuterol HFA as directed   This is a 37 male who presents to the office today for follow-up of ER visit 4 days ago.  He presented to the ER with chest and left arm pain.  He was mildly hypertensive and given pain radiating to his posterior shoulder and down his arm, cardiac work-up as well as CT angiogram were performed.  Negative EKG, negative CT angiogram, negative troponin x2.  He was given albuterol inhaler and doxycycline for "congestion." Has had some heart racing with using albuterol inhaler (used 6 times in 1 day). Felt SOB yesterday, had to stop at work and sit down. SOB with exertion recent, approximately 1 week. Pain in left arm has resolved, more tingling in extremity.  He continues to have chest "pressure," which is significantly improved from ER visit.  Cigarette smoking- wants to stop, has tried Chantix, gum, vaping, problems with patches sticking and gave him dry mouth.   Penile lesion-he was previously seen in the office with a singular lesion and given Valtrex.  He would like to be tested for herpes simplex.  He is currently in a monogamous relationship and does not need additional STD testing today.  Past Medical History:  Diagnosis Date  . Asthma    childhood  . GERD (gastroesophageal reflux disease)   . Hepatitis    age 11  . Pre-diabetes   . Pulmonary nodule 12/17/2018   needs follow up CT in 1 year   Past Surgical History:  Procedure Laterality Date  . GANGLION CYST EXCISION Left   . lymph node     lymph node removed under left arm due to cat scratch fever. as a kid   Family History  Problem Relation Age of Onset  . Arthritis Mother   . COPD Mother   . Diabetes Mother   . Heart disease Mother   . Colon cancer Mother 24  . Congestive Heart Failure Mother    . Alcohol abuse Father   . Drug abuse Father   . Early death Father   . Kidney disease Father   . Cirrhosis Father   . Asthma Sister    Social History   Tobacco Use  . Smoking status: Current Every Day Smoker    Packs/day: 1.00    Years: 30.00    Pack years: 30.00    Types: Cigarettes  . Smokeless tobacco: Current User    Types: Chew  Substance Use Topics  . Alcohol use: Yes    Comment: 1-2 times per year  . Drug use: Not Currently      Review of Systems Per HPI    Objective:   Physical Exam Physical Exam  Constitutional: Oriented to person, place, and time. He appears well-developed and well-nourished.  HENT:  Head: Normocephalic and atraumatic.  Eyes: Conjunctivae are normal.  Neck: Normal range of motion. Neck supple.  Cardiovascular: Normal rate, regular rhythm and normal heart sounds.   Pulmonary/Chest: Effort normal and breath sounds normal.  Musculoskeletal: Normal gait.  No lower extremity edema.  Neurological: Alert and oriented to person, place, and time.  Skin: Skin is warm and dry.  Psychiatric: Normal mood and affect. Behavior is normal. Judgment and thought content normal.  Vitals reviewed.     BP  140/80 (BP Location: Right Arm, Patient Position: Sitting, Cuff Size: Large)   Pulse 87   Temp 98.7 F (37.1 C) (Temporal)   Ht 6\' 3"  (1.905 m)   Wt 250 lb 12.8 oz (113.8 kg)   SpO2 97%   BMI 31.35 kg/m  Wt Readings from Last 3 Encounters:  11/06/19 250 lb 12.8 oz (113.8 kg)  11/02/19 243 lb (110.2 kg)  08/31/19 238 lb 8 oz (108.2 kg)        Assessment & Plan:  1. Obesity (BMI 30-39.9) - Ambulatory referral to Cardiology  2. Atypical chest pain - Ambulatory referral to Cardiology -Reviewed ER notes and imaging/EKG/lab tests which are reassuring.  Given patient's recent dyspnea, multiple risk factors for coronary artery disease, will refer him to cardiology  3. Tobacco abuse -Provided him written and verbal information regarding smoking  cessation and encouraged his immediate efforts - Ambulatory referral to Cardiology  4. Penile lesion -He desires to know his HSV status and we will check blood work today - HSV(herpes simplex vrs) 1+2 ab-IgM  5.  Incidental pulmonary nodule -He had multiple nodules found on CT of the abdomen to 2020.  On most recent CT angiogram 11/02/2019, he had a 4 mm pulmonary nodule in the right middle lobe.  Discussed findings with patient.  Given his smoking history, recommend CT of the chest in 1 year.  This visit occurred during the SARS-CoV-2 public health emergency.  Safety protocols were in place, including screening questions prior to the visit, additional usage of staff PPE, and extensive cleaning of exam room while observing appropriate contact time as indicated for disinfecting solutions.    Clarene Reamer, FNP-BC  [facility]  11/06/2019 11:31 AM

## 2019-11-10 LAB — HSV(HERPES SIMPLEX VRS) I + II AB-IGG
HAV 1 IGG,TYPE SPECIFIC AB: <0.9 index
HSV 2 IGG,TYPE SPECIFIC AB: 12.9 index — ABNORMAL HIGH

## 2019-11-10 LAB — HSV 1/2 AB (IGM), IFA W/RFLX TITER
HSV 1 IgM Screen: NEGATIVE
HSV 2 IgM Screen: NEGATIVE

## 2019-11-11 ENCOUNTER — Encounter: Payer: Self-pay | Admitting: Family Medicine

## 2019-11-23 ENCOUNTER — Ambulatory Visit (INDEPENDENT_AMBULATORY_CARE_PROVIDER_SITE_OTHER): Payer: BC Managed Care – PPO | Admitting: Cardiology

## 2019-11-23 ENCOUNTER — Other Ambulatory Visit: Payer: Self-pay

## 2019-11-23 ENCOUNTER — Encounter: Payer: Self-pay | Admitting: Cardiology

## 2019-11-23 VITALS — BP 140/88 | HR 78 | Ht 74.0 in | Wt 248.8 lb

## 2019-11-23 DIAGNOSIS — R072 Precordial pain: Secondary | ICD-10-CM

## 2019-11-23 DIAGNOSIS — R079 Chest pain, unspecified: Secondary | ICD-10-CM

## 2019-11-23 DIAGNOSIS — Z720 Tobacco use: Secondary | ICD-10-CM | POA: Diagnosis not present

## 2019-11-23 MED ORDER — METOPROLOL TARTRATE 100 MG PO TABS: ORAL_TABLET | ORAL | 0 refills | Status: DC

## 2019-11-23 NOTE — Patient Instructions (Signed)
Medication Instructions:  - Your physician recommends that you continue on your current medications as directed. Please refer to the Current Medication list given to you today.  *If you need a refill on your cardiac medications before your next appointment, please call your pharmacy*  Lab Work: - none ordered  If you have labs (blood work) drawn today and your tests are completely normal, you will receive your results only by: Marland Kitchen MyChart Message (if you have MyChart) OR . A paper copy in the mail If you have any lab test that is abnormal or we need to change your treatment, we will call you to review the results.  Testing/Procedures: - Your physician has requested that you have an echocardiogram. Echocardiography is a painless test that uses sound waves to create images of your heart. It provides your doctor with information about the size and shape of your heart and how well your heart's chambers and valves are working. This procedure takes approximately one hour. There are no restrictions for this procedure.  - Your physician has requested that you have cardiac CT. Cardiac computed tomography (CT) is a painless test that uses an x-ray machine to take clear, detailed pictures of your heart.   - Your cardiac CT will be scheduled at one of the below locations:   [facility] [address] [place], [place] [address] (336) [place] [address] [place], [place] [address] [phone]  If scheduled at [facility], please arrive at the [facility] main entrance of [facility] 30-45 minutes prior to test start time. Proceed to the [facility] Radiology Department (first floor) to check-in and test prep.  If scheduled at [facility], please arrive 15 mins early for check-in and test prep.  Please follow these instructions carefully (unless otherwise directed):  Hold all  erectile dysfunction medications at least 3 days (72 hrs) prior to test.  On the Night Before the Test: . Be sure to Drink plenty of water. . Do not consume any caffeinated/decaffeinated beverages or chocolate 12 hours prior to your test. . Do not take any antihistamines 12 hours prior to your test.  On the Day of the Test: . Drink plenty of water. Do not drink any water within one hour of the test. . Do not eat any food 4 hours prior to the test. . You may take your regular medications prior to the test.  . Take metoprolol (Lopressor) 100 mg x 1 dose two hours prior to test.       After the Test: . Drink plenty of water. . After receiving IV contrast, you may experience a mild flushed feeling. This is normal. . On occasion, you may experience a mild rash up to 24 hours after the test. This is not dangerous. If this occurs, you can take Benadryl 25 mg and increase your fluid intake. . If you experience trouble breathing, this can be serious. If it is severe call 911 IMMEDIATELY. If it is mild, please call our office. . If you take any of these medications: Glipizide/Metformin, Avandament, Glucavance, please do not take 48 hours after completing test unless otherwise instructed.   Once we have confirmed authorization from your insurance company, we will call you to set up a date and time for your test.   For non-scheduling related questions, please contact the cardiac imaging nurse navigator should you have any questions/concerns: Marchia Bond, RN Navigator Cardiac  Imaging [facility] [phone] Office   Follow-Up: At [facility], you and your health needs are our priority.  As part of our continuing mission to provide you with exceptional heart care, we have created designated Provider Care Teams.  These Care Teams include your primary Cardiologist (physician) and Advanced Practice Providers (APPs -  Physician Assistants and Nurse Practitioners) who all  work together to provide you with the care you need, when you need it.  Your next appointment:   6 week(s)  The format for your next appointment:   In Person  Provider:   Kate Sable, MD  Other Instructions N/a   Echocardiogram An echocardiogram is a procedure that uses painless sound waves (ultrasound) to produce an image of the heart. Images from an echocardiogram can provide important information about:  Signs of coronary artery disease (CAD).  Aneurysm detection. An aneurysm is a weak or damaged part of an artery wall that bulges out from the normal force of blood pumping through the body.  Heart size and shape. Changes in the size or shape of the heart can be associated with certain conditions, including heart failure, aneurysm, and CAD.  Heart muscle function.  Heart valve function.  Signs of a past heart attack.  Fluid buildup around the heart.  Thickening of the heart muscle.  A tumor or infectious growth around the heart valves. Tell a health care provider about:  Any allergies you have.  All medicines you are taking, including vitamins, herbs, eye drops, creams, and over-the-counter medicines.  Any blood disorders you have.  Any surgeries you have had.  Any medical conditions you have.  Whether you are pregnant or may be pregnant. What are the risks? Generally, this is a safe procedure. However, problems may occur, including:  Allergic reaction to dye (contrast) that may be used during the procedure. What happens before the procedure? No specific preparation is needed. You may eat and drink normally. What happens during the procedure?   An IV tube may be inserted into one of your veins.  You may receive contrast through this tube. A contrast is an injection that improves the quality of the pictures from your heart.  A gel will be applied to your chest.  A wand-like tool (transducer) will be moved over your chest. The gel will help to  transmit the sound waves from the transducer.  The sound waves will harmlessly bounce off of your heart to allow the heart images to be captured in real-time motion. The images will be recorded on a computer. The procedure may vary among health care providers and hospitals. What happens after the procedure?  You may return to your normal, everyday life, including diet, activities, and medicines, unless your health care provider tells you not to do that. Summary  An echocardiogram is a procedure that uses painless sound waves (ultrasound) to produce an image of the heart.  Images from an echocardiogram can provide important information about the size and shape of your heart, heart muscle function, heart valve function, and fluid buildup around your heart.  You do not need to do anything to prepare before this procedure. You may eat and drink normally.  After the echocardiogram is completed, you may return to your normal, everyday life, unless your health care provider tells you not to do that. This information is not intended to replace advice given to you by your health care provider. Make sure you discuss any questions you have with your  health care provider. Document Revised: 02/05/2019 Document Reviewed: 11/17/2016 Elsevier Patient Education  [place].    Cardiac CT Angiogram A cardiac CT angiogram is a procedure to look at the heart and the area around the heart. It may be done to help find the cause of chest pains or other symptoms of heart disease. During this procedure, a substance called contrast dye is injected into the blood vessels in the area to be checked. A large X-ray machine, called a CT scanner, then takes detailed pictures of the heart and the surrounding area. The procedure is also sometimes called a coronary CT angiogram, coronary artery scanning, or CTA. A cardiac CT angiogram allows the health care provider to see how well blood is flowing to and from the  heart. The health care provider will be able to see if there are any problems, such as:  Blockage or narrowing of the coronary arteries in the heart.  Fluid around the heart.  Signs of weakness or disease in the muscles, valves, and tissues of the heart. Tell a health care provider about:  Any allergies you have. This is especially important if you have had a previous allergic reaction to contrast dye.  All medicines you are taking, including vitamins, herbs, eye drops, creams, and over-the-counter medicines.  Any blood disorders you have.  Any surgeries you have had.  Any medical conditions you have.  Whether you are pregnant or may be pregnant.  Any anxiety disorders, chronic pain, or other conditions you have that may increase your stress or prevent you from lying still. What are the risks? Generally, this is a safe procedure. However, problems may occur, including:  Bleeding.  Infection.  Allergic reactions to medicines or dyes.  Damage to other structures or organs.  Kidney damage from the contrast dye that is used.  Increased risk of cancer from radiation exposure. This risk is low. Talk with your health care provider about: ? The risks and benefits of testing. ? How you can receive the lowest dose of radiation. What happens before the procedure?  Wear comfortable clothing and remove any jewelry, glasses, dentures, and hearing aids.  Follow instructions from your health care provider about eating and drinking. This may include: ? For 12 hours before the procedure -- avoid caffeine. This includes tea, coffee, soda, energy drinks, and diet pills. Drink plenty of water or other fluids that do not have caffeine in them. Being well hydrated can prevent complications. ? For 4-6 hours before the procedure -- stop eating and drinking. The contrast dye can cause nausea, but this is less likely if your stomach is empty.  Ask your health care provider about changing or  stopping your regular medicines. This is especially important if you are taking diabetes medicines, blood thinners, or medicines to treat problems with erections (erectile dysfunction). What happens during the procedure?   Hair on your chest may need to be removed so that small sticky patches called electrodes can be placed on your chest. These will transmit information that helps to monitor your heart during the procedure.  An IV will be inserted into one of your veins.  You might be given a medicine to control your heart rate during the procedure. This will help to ensure that good images are obtained.  You will be asked to lie on an exam table. This table will slide in and out of the CT machine during the procedure.  Contrast dye will be injected into the IV. You might  feel warm, or you may get a metallic taste in your mouth.  You will be given a medicine called nitroglycerin. This will relax or dilate the arteries in your heart.  The table that you are lying on will move into the CT machine tunnel for the scan.  The person running the machine will give you instructions while the scans are being done. You may be asked to: ? Keep your arms above your head. ? Hold your breath. ? Stay very still, even if the table is moving.  When the scanning is complete, you will be moved out of the machine.  The IV will be removed. The procedure may vary among health care providers and hospitals. What can I expect after the procedure? After your procedure, it is common to have:  A metallic taste in your mouth from the contrast dye.  A feeling of warmth.  A headache from the nitroglycerin. Follow these instructions at home:  Take over-the-counter and prescription medicines only as told by your health care provider.  If you are told, drink enough fluid to keep your urine pale yellow. This will help to flush the contrast dye out of your body.  Most people can return to their normal activities  right after the procedure. Ask your health care provider what activities are safe for you.  It is up to you to get the results of your procedure. Ask your health care provider, or the department that is doing the procedure, when your results will be ready.  Keep all follow-up visits as told by your health care provider. This is important. Contact a health care provider if:  You have any symptoms of allergy to the contrast dye. These include: ? Shortness of breath. ? Rash or hives. ? A racing heartbeat. Summary  A cardiac CT angiogram is a procedure to look at the heart and the area around the heart. It may be done to help find the cause of chest pains or other symptoms of heart disease.  During this procedure, a large X-ray machine, called a CT scanner, takes detailed pictures of the heart and the surrounding area after a contrast dye has been injected into blood vessels in the area.  Ask your health care provider about changing or stopping your regular medicines before the procedure. This is especially important if you are taking diabetes medicines, blood thinners, or medicines to treat erectile dysfunction.  If you are told, drink enough fluid to keep your urine pale yellow. This will help to flush the contrast dye out of your body. This information is not intended to replace advice given to you by your health care provider. Make sure you discuss any questions you have with your health care provider. Document Revised: 06/10/2019 Document Reviewed: 06/10/2019 Elsevier Patient Education  [place].

## 2019-11-23 NOTE — Progress Notes (Signed)
Cardiology Office Note:    Date:  11/23/2019   ID:  Frank Stevens, DOB [birth year], MRN [id number]  PCP:  Lesleigh Noe, MD  Cardiologist:  Kate Sable, MD  Electrophysiologist:  None   Referring MD: Lesleigh Noe, MD   Chief Complaint  Patient presents with  . New Patient (Initial Visit)    ED F/U-chest pain; Meds verbally reviewed with patient.    History of Present Illness:    Frank Stevens is a 44 y.o. male with a hx of asthma, GERD, current smoker x30 years presenting with chest pain.  Patient states symptoms of chest pain began about 4 weeks ago.  Chest pain occurs with exertion or brisk walk and improves with rest.  Patient was at work when he first experienced severe chest discomfort while walking.  He presented to the ED 3 weeks ago on 11/02/2019 for chest pain.  High-sensitivity troponins were normal, CT angiogram was negative for PE.  EKG did not show any ST changes.  Since then, patient states symptoms have occurred a couple of times usually when walking.  He denies any history of heart disease.  Past Medical History:  Diagnosis Date  . Asthma    childhood  . GERD (gastroesophageal reflux disease)   . Hepatitis    age 1  . Pre-diabetes   . Pulmonary nodule 12/17/2018   needs follow up CT in 1 year    Past Surgical History:  Procedure Laterality Date  . GANGLION CYST EXCISION Left   . lymph node     lymph node removed under left arm due to cat scratch fever. as a kid    Current Medications: Current Meds  Medication Sig  . valACYclovir (VALTREX) 500 MG tablet Take 1 tablet (500 mg total) by mouth daily.     Allergies:   Penicillins   Social History   Socioeconomic History  . Marital status: Single    Spouse name: Frank Stevens  . Number of children: 1  . Years of education: GED, associate degree  . Highest education level: Not on file  Occupational History  . Not on file  Tobacco Use  . Smoking status: Current Every Day Smoker    Packs/day: 0.50      Years: 30.00    Pack years: 15.00    Types: Cigarettes  . Smokeless tobacco: Current User    Types: Chew  Substance and Sexual Activity  . Alcohol use: Yes    Comment: 1-2 times per year  . Drug use: Not Currently  . Sexual activity: Yes    Birth control/protection: Pill  Other Topics Concern  . 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 Difficulty of Paying Living Expenses: Not on file  Food Insecurity:   . Worried About Charity fundraiser in the Last Year: Not on file  . Ran Out of Food in the Last Year: Not on file  Transportation Needs:   . Lack of Transportation (Medical): Not on file  . Lack of Transportation (Non-Medical): Not on file  Physical Activity:   . Days of Exercise per Week: Not on file  .  Minutes of Exercise per Session: Not on file  Stress:   . Feeling of Stress : Not on file  Social Connections:   . Frequency of Communication with Friends and Family: Not on file  . Frequency of Social Gatherings with Friends and Family: Not on file  . Attends Religious Services: Not on file  . Active Member of Clubs or Organizations: Not on file  . Attends Archivist Meetings: Not on file  . Marital Status: Not on file     Family History: The patient's family history includes Alcohol abuse in his father; Arthritis in his mother; Asthma in his sister; COPD in his mother; Cirrhosis in his father; Colon cancer (age of onset: 7) in his mother; Congestive Heart Failure in his mother; Diabetes in his mother; Drug abuse in his father; Early death in his father; Heart disease in his mother; Kidney disease in his father.  ROS:   Please see the history of present illness.     All  other systems reviewed and are negative.  EKGs/Labs/Other Studies Reviewed:    The following studies were reviewed today:   EKG:  EKG is  ordered today.  The ekg ordered today demonstrates normal sinus rhythm, normal ECG  Recent Labs: 11/02/2019: ALT 20; BUN 17; Creatinine, Ser 0.89; Hemoglobin 17.4; Platelets 390; Potassium 4.6; Sodium 137  Recent Lipid Panel    Component Value Date/Time   CHOL 178 02/22/2017 0000   TRIG 237 (A) 02/22/2017 0000   HDL 28 (A) 02/22/2017 0000   LDLCALC 103 02/22/2017 0000    Physical Exam:    VS:  BP 140/88 (BP Location: Right Arm, Patient Position: Sitting, Cuff Size: Large)   Pulse 78   Ht 6\' 2"  (1.88 m)   Wt 248 lb 12 oz (112.8 kg)   SpO2 97%   BMI 31.94 kg/m     Wt Readings from Last 3 Encounters:  11/23/19 248 lb 12 oz (112.8 kg)  11/06/19 250 lb 12.8 oz (113.8 kg)  11/02/19 243 lb (110.2 kg)     GEN:  Well nourished, well developed in no acute distress HEENT: Normal NECK: No JVD; No carotid bruits LYMPHATICS: No lymphadenopathy CARDIAC: RRR, no murmurs, rubs, gallops RESPIRATORY:  Clear to auscultation without rales, wheezing or rhonchi  ABDOMEN: Soft, non-tender, non-distended MUSCULOSKELETAL:  No edema; No deformity  SKIN: Warm and dry NEUROLOGIC:  Alert and oriented x 3 PSYCHIATRIC:  Normal affect   ASSESSMENT:    1. Chest pain of uncertain etiology   2. Tobacco use   3. Precordial pain    PLAN:    In order of problems listed above:  1. Patient symptoms of chest pain are consistent with angina.  His risk factors include smoking.  Will evaluate presence of CAD with coronary CT angiogram.  Get echocardiogram to evaluate wall motion. 2. Smoking cessation advised.  Patient is planning on quitting.  Over 5 minutes spent counseling patient.  Follow-up after coronary CTA and echocardiogram.  This note was generated in part or whole with voice recognition software. Voice recognition is usually quite accurate but there are  transcription errors that can and very often do occur. I apologize for any typographical errors that were not detected and corrected.  Medication Adjustments/Labs and Tests Ordered: Current medicines are reviewed at length with the patient today.  Concerns regarding medicines are outlined above.  Orders Placed This Encounter  Procedures  . CT CORONARY MORPH W/CTA COR W/SCORE W/CA W/CM &/OR WO/CM  . CT  CORONARY FRACTIONAL FLOW RESERVE DATA PREP  . CT CORONARY FRACTIONAL FLOW RESERVE FLUID ANALYSIS  . EKG 12-Lead  . ECHOCARDIOGRAM COMPLETE   Meds ordered this encounter  Medications  . metoprolol tartrate (LOPRESSOR) 100 MG tablet    Sig: Take 1 tablet (100 mg) by mouth x 1 dose, 2 hours prior to your Cardiac CT    Dispense:  1 tablet    Refill:  0    Patient Instructions  Medication Instructions:  - Your physician recommends that you continue on your current medications as directed. Please refer to the Current Medication list given to you today.  *If you need a refill on your cardiac medications before your next appointment, please call your pharmacy*  Lab Work: - none ordered  If you have labs (blood work) drawn today and your tests are completely normal, you will receive your results only by: Marland Kitchen MyChart Message (if you have MyChart) OR . A paper copy in the mail If you have any lab test that is abnormal or we need to change your treatment, we will call you to review the results.  Testing/Procedures: - Your physician has requested that you have an echocardiogram. Echocardiography is a painless test that uses sound waves to create images of your heart. It provides your doctor with information about the size and shape of your heart and how well your heart's chambers and valves are working. This procedure takes approximately one hour. There are no restrictions for this procedure.  - Your physician has requested that you have cardiac CT. Cardiac computed tomography (CT) is a painless  test that uses an x-ray machine to take clear, detailed pictures of your heart.   - Your cardiac CT will be scheduled at one of the below locations:   [facility] [address] [place], [place] [address] (336) [place] [address] [place], [place] [address] [phone]  If scheduled at [facility], please arrive at the [facility] main entrance of [facility] 30-45 minutes prior to test start time. Proceed to the [facility] Radiology Department (first floor) to check-in and test prep.  If scheduled at [facility], please arrive 15 mins early for check-in and test prep.  Please follow these instructions carefully (unless otherwise directed):  Hold all erectile dysfunction medications at least 3 days (72 hrs) prior to test.  On the Night Before the Test: . Be sure to Drink plenty of water. . Do not consume any caffeinated/decaffeinated beverages or chocolate 12 hours prior to your test. . Do not take any antihistamines 12 hours prior to your test.  On the Day of the Test: . Drink plenty of water. Do not drink any water within one hour of the test. . Do not eat any food 4 hours prior to the test. . You may take your regular medications prior to the test.  . Take metoprolol (Lopressor) 100 mg x 1 dose two hours prior to test.       After the Test: . Drink plenty of water. . After receiving IV contrast, you may experience a mild flushed feeling. This is normal. . On occasion, you may experience a mild rash up to 24 hours after the test. This is not dangerous. If this occurs, you can take Benadryl 25 mg and increase your fluid intake. . If you experience trouble breathing, this can be serious. If it is severe call 911 IMMEDIATELY.  If it is mild, please call our office. . If you take any of these medications: Glipizide/Metformin, Avandament, Glucavance, please do  not take 48 hours after completing test unless otherwise instructed.   Once we have confirmed authorization from your insurance company, we will call you to set up a date and time for your test.   For non-scheduling related questions, please contact the cardiac imaging nurse navigator should you have any questions/concerns: Marchia Bond, RN Navigator Cardiac Imaging [facility] [phone] Office   Follow-Up: At [facility], you and your health needs are our priority.  As part of our continuing mission to provide you with exceptional heart care, we have created designated Provider Care Teams.  These Care Teams include your primary Cardiologist (physician) and Advanced Practice Providers (APPs -  Physician Assistants and Nurse Practitioners) who all work together to provide you with the care you need, when you need it.  Your next appointment:   6 week(s)  The format for your next appointment:   In Person  Provider:   Kate Sable, MD  Other Instructions N/a   Echocardiogram An echocardiogram is a procedure that uses painless sound waves (ultrasound) to produce an image of the heart. Images from an echocardiogram can provide important information about:  Signs of coronary artery disease (CAD).  Aneurysm detection. An aneurysm is a weak or damaged part of an artery wall that bulges out from the normal force of blood pumping through the body.  Heart size and shape. Changes in the size or shape of the heart can be associated with certain conditions, including heart failure, aneurysm, and CAD.  Heart muscle function.  Heart valve function.  Signs of a past heart attack.  Fluid buildup around the heart.  Thickening of the heart muscle.  A tumor or infectious growth around the heart valves. Tell a health care provider about:  Any allergies you have.  All medicines you are taking, including vitamins, herbs, eye drops, creams, and  over-the-counter medicines.  Any blood disorders you have.  Any surgeries you have had.  Any medical conditions you have.  Whether you are pregnant or may be pregnant. What are the risks? Generally, this is a safe procedure. However, problems may occur, including:  Allergic reaction to dye (contrast) that may be used during the procedure. What happens before the procedure? No specific preparation is needed. You may eat and drink normally. What happens during the procedure?   An IV tube may be inserted into one of your veins.  You may receive contrast through this tube. A contrast is an injection that improves the quality of the pictures from your heart.  A gel will be applied to your chest.  A wand-like tool (transducer) will be moved over your chest. The gel will help to transmit the sound waves from the transducer.  The sound waves will harmlessly bounce off of your heart to allow the heart images to be captured in real-time motion. The images will be recorded on a computer. The procedure may vary among health care providers and hospitals. What happens after the procedure?  You may return to your normal, everyday life, including diet, activities, and medicines, unless your health care provider tells you not to do that. Summary  An echocardiogram is a procedure that uses painless sound waves (ultrasound) to produce an image of the heart.  Images from an echocardiogram can provide important information about the size and shape of your heart, heart muscle function,  heart valve function, and fluid buildup around your heart.  You do not need to do anything to prepare before this procedure. You may eat and drink normally.  After the echocardiogram is completed, you may return to your normal, everyday life, unless your health care provider tells you not to do that. This information is not intended to replace advice given to you by your health care provider. Make sure you discuss  any questions you have with your health care provider. Document Revised: 02/05/2019 Document Reviewed: 11/17/2016 Elsevier Patient Education  [place].    Cardiac CT Angiogram A cardiac CT angiogram is a procedure to look at the heart and the area around the heart. It may be done to help find the cause of chest pains or other symptoms of heart disease. During this procedure, a substance called contrast dye is injected into the blood vessels in the area to be checked. A large X-ray machine, called a CT scanner, then takes detailed pictures of the heart and the surrounding area. The procedure is also sometimes called a coronary CT angiogram, coronary artery scanning, or CTA. A cardiac CT angiogram allows the health care provider to see how well blood is flowing to and from the heart. The health care provider will be able to see if there are any problems, such as:  Blockage or narrowing of the coronary arteries in the heart.  Fluid around the heart.  Signs of weakness or disease in the muscles, valves, and tissues of the heart. Tell a health care provider about:  Any allergies you have. This is especially important if you have had a previous allergic reaction to contrast dye.  All medicines you are taking, including vitamins, herbs, eye drops, creams, and over-the-counter medicines.  Any blood disorders you have.  Any surgeries you have had.  Any medical conditions you have.  Whether you are pregnant or may be pregnant.  Any anxiety disorders, chronic pain, or other conditions you have that may increase your stress or prevent you from lying still. What are the risks? Generally, this is a safe procedure. However, problems may occur, including:  Bleeding.  Infection.  Allergic reactions to medicines or dyes.  Damage to other structures or organs.  Kidney damage from the contrast dye that is used.  Increased risk of cancer from radiation exposure. This risk is low. Talk  with your health care provider about: ? The risks and benefits of testing. ? How you can receive the lowest dose of radiation. What happens before the procedure?  Wear comfortable clothing and remove any jewelry, glasses, dentures, and hearing aids.  Follow instructions from your health care provider about eating and drinking. This may include: ? For 12 hours before the procedure -- avoid caffeine. This includes tea, coffee, soda, energy drinks, and diet pills. Drink plenty of water or other fluids that do not have caffeine in them. Being well hydrated can prevent complications. ? For 4-6 hours before the procedure -- stop eating and drinking. The contrast dye can cause nausea, but this is less likely if your stomach is empty.  Ask your health care provider about changing or stopping your regular medicines. This is especially important if you are taking diabetes medicines, blood thinners, or medicines to treat problems with erections (erectile dysfunction). What happens during the procedure?   Hair on your chest may need to be removed so that small sticky patches called electrodes can be placed on your chest. These will transmit information that helps  to monitor your heart during the procedure.  An IV will be inserted into one of your veins.  You might be given a medicine to control your heart rate during the procedure. This will help to ensure that good images are obtained.  You will be asked to lie on an exam table. This table will slide in and out of the CT machine during the procedure.  Contrast dye will be injected into the IV. You might feel warm, or you may get a metallic taste in your mouth.  You will be given a medicine called nitroglycerin. This will relax or dilate the arteries in your heart.  The table that you are lying on will move into the CT machine tunnel for the scan.  The person running the machine will give you instructions while the scans are being done. You may be  asked to: ? Keep your arms above your head. ? Hold your breath. ? Stay very still, even if the table is moving.  When the scanning is complete, you will be moved out of the machine.  The IV will be removed. The procedure may vary among health care providers and hospitals. What can I expect after the procedure? After your procedure, it is common to have:  A metallic taste in your mouth from the contrast dye.  A feeling of warmth.  A headache from the nitroglycerin. Follow these instructions at home:  Take over-the-counter and prescription medicines only as told by your health care provider.  If you are told, drink enough fluid to keep your urine pale yellow. This will help to flush the contrast dye out of your body.  Most people can return to their normal activities right after the procedure. Ask your health care provider what activities are safe for you.  It is up to you to get the results of your procedure. Ask your health care provider, or the department that is doing the procedure, when your results will be ready.  Keep all follow-up visits as told by your health care provider. This is important. Contact a health care provider if:  You have any symptoms of allergy to the contrast dye. These include: ? Shortness of breath. ? Rash or hives. ? A racing heartbeat. Summary  A cardiac CT angiogram is a procedure to look at the heart and the area around the heart. It may be done to help find the cause of chest pains or other symptoms of heart disease.  During this procedure, a large X-ray machine, called a CT scanner, takes detailed pictures of the heart and the surrounding area after a contrast dye has been injected into blood vessels in the area.  Ask your health care provider about changing or stopping your regular medicines before the procedure. This is especially important if you are taking diabetes medicines, blood thinners, or medicines to treat erectile dysfunction.  If  you are told, drink enough fluid to keep your urine pale yellow. This will help to flush the contrast dye out of your body. This information is not intended to replace advice given to you by your health care provider. Make sure you discuss any questions you have with your health care provider. Document Revised: 06/10/2019 Document Reviewed: 06/10/2019 Elsevier Patient Education  2020 [place], Kate Sable, MD  11/23/2019 11:55 AM    [place]

## 2019-12-03 ENCOUNTER — Encounter: Payer: Self-pay | Admitting: Family Medicine

## 2019-12-03 ENCOUNTER — Ambulatory Visit (INDEPENDENT_AMBULATORY_CARE_PROVIDER_SITE_OTHER): Payer: BC Managed Care – PPO | Admitting: Family Medicine

## 2019-12-03 ENCOUNTER — Other Ambulatory Visit: Payer: Self-pay

## 2019-12-03 VITALS — BP 148/88 | HR 94 | Temp 98.7°F | Ht 75.0 in | Wt 253.5 lb

## 2019-12-03 DIAGNOSIS — N529 Male erectile dysfunction, unspecified: Secondary | ICD-10-CM | POA: Diagnosis not present

## 2019-12-03 DIAGNOSIS — I10 Essential (primary) hypertension: Secondary | ICD-10-CM | POA: Diagnosis not present

## 2019-12-03 DIAGNOSIS — Z720 Tobacco use: Secondary | ICD-10-CM

## 2019-12-03 DIAGNOSIS — R911 Solitary pulmonary nodule: Secondary | ICD-10-CM

## 2019-12-03 NOTE — Patient Instructions (Addendum)
Your blood pressure high.   High blood pressure increases your risk for heart attack and stroke.    Please check your blood pressure 2-4 times a week.   To check your blood pressure 1) Sit in a quiet and relaxed place for 5 minutes 2) Make sure your feet are flat on the ground 3) Consider checking first thing in the morning   Normal blood pressure is less than 140/90 Ideally you blood pressure should be around 120/80  Other ways you can reduce your blood pressure:  1) Regular exercise -- Try to get 150 minutes (30 minutes, 5 days a week) of moderate to vigorous aerobic excercise -- Examples: brisk walking (2.5 miles per hour), water aerobics, dancing, gardening, tennis, biking slower than 10 miles per hour 2) DASH Diet - low fat meats, more fresh fruits and vegetables, whole grains, low salt 3) Quit smoking if you smoke 4) Loose 5-10% of your body weight   For erectile dysfunction  - see what cardiology work-up shows - at follow-up get their opinion on safety - we can consider Cialis or Viagra

## 2019-12-03 NOTE — Assessment & Plan Note (Signed)
Working on quitting. Vaping currently with plan to stop at the end of february

## 2019-12-03 NOTE — Progress Notes (Signed)
Subjective:     Frank Stevens is a 44 y.o. male presenting for Referral (to pulmonologist)     HPI   #Tobacco - wanting to follow with pulmonology - Nodule - wants  - is coughing more things up since quitting smoking  #chest pain - is seeing cardiology -   #erectile dysfunction - is able to get an erection - difficulty maintaining - relationship with his GF is overall good relationship - has been happening for a few year - thought it might be stress   Has been exercising Taking fish oil and multivitamin   Review of Systems   Social History   Tobacco Use  Smoking Status Former Smoker  . Packs/day: 0.50  . Years: 30.00  . Pack years: 15.00  . Types: Cigarettes  . Quit date: 11/26/2019  . Years since quitting: 0.0  Smokeless Tobacco Current User  . Types: Chew        Objective:    BP Readings from Last 3 Encounters:  12/03/19 (!) 148/88  11/23/19 140/88  11/06/19 140/80   Wt Readings from Last 3 Encounters:  12/03/19 253 lb 8 oz (115 kg)  11/23/19 248 lb 12 oz (112.8 kg)  11/06/19 250 lb 12.8 oz (113.8 kg)    BP (!) 148/88   Pulse 94   Temp 98.7 F (37.1 C)   Ht 6\' 3"  (1.905 m)   Wt 253 lb 8 oz (115 kg)   SpO2 95%   BMI 31.69 kg/m    Physical Exam Constitutional:      Appearance: Normal appearance. He is not ill-appearing or diaphoretic.  HENT:     Right Ear: External ear normal.     Left Ear: External ear normal.     Nose: Nose normal.  Eyes:     General: No scleral icterus.    Extraocular Movements: Extraocular movements intact.     Conjunctiva/sclera: Conjunctivae normal.  Cardiovascular:     Rate and Rhythm: Normal rate and regular rhythm.     Heart sounds: No murmur.  Pulmonary:     Effort: Pulmonary effort is normal. No respiratory distress.     Breath sounds: Normal breath sounds. No wheezing.  Musculoskeletal:     Cervical back: Neck supple.  Skin:    General: Skin is warm and dry.  Neurological:     Mental Status:  He is alert. Mental status is at baseline.  Psychiatric:        Mood and Affect: Mood normal.    CT ANGIO CHEST AORTA W/CM & OR WO/CM CLINICAL DATA:  Left shoulder and arm pain. Concern for dissection.  EXAM: CT ANGIOGRAPHY CHEST WITH CONTRAST  TECHNIQUE: Multidetector CT imaging of the chest was performed using the standard protocol during bolus administration of intravenous contrast. Multiplanar CT image reconstructions and MIPs were obtained to evaluate the vascular anatomy.  CONTRAST:  47mL OMNIPAQUE IOHEXOL 350 MG/ML SOLN  COMPARISON:  None.  FINDINGS: Cardiovascular: There is no evidence for a thoracic aortic dissection or aneurysm. The arch vessels are widely patent. The heart size is normal. There is no significant pericardial effusion.  Mediastinum/Nodes:  --No mediastinal or hilar lymphadenopathy.  --No axillary lymphadenopathy.  --No supraclavicular lymphadenopathy.  --Normal thyroid gland.  --The esophagus is unremarkable  Lungs/Pleura: There is a 4 mm pulmonary nodule in the right middle lobe. There is no pneumothorax. No large pleural effusion. There is no focal infiltrate.  Upper Abdomen: No acute abnormality.  Musculoskeletal: No chest wall abnormality. No acute  or significant osseous findings.  Review of the MIP images confirms the above findings.  IMPRESSION: 1. No evidence for aortic dissection or aneurysm. 2. A 4 mm pulmonary nodule in the right middle lobe. In a low risk patient, no further follow-up is required. In a high-risk patient, a 12 month follow-up CT is recommended.  Electronically Signed   By: Constance Holster M.D.   On: 11/02/2019 15:20 DG Chest 2 View CLINICAL DATA:  Chest pain  EXAM: CHEST - 2 VIEW  COMPARISON:  03/06/2016  FINDINGS: The heart size and mediastinal contours are within normal limits. Both lungs are clear. The visualized skeletal structures are unremarkable.  IMPRESSION: No acute abnormality of  the lungs.  Electronically Signed   By: Eddie Candle M.D.   On: 11/02/2019 13:07         Assessment & Plan:   Problem List Items Addressed This Visit      Cardiovascular and Mediastinum   Essential hypertension    BP elevated. Discussed home monitoring, weight loss, exercise. Pt just quit smoking. Return in 3 months.         Other   Tobacco use    Working on quitting. Vaping currently with plan to stop at the end of february      Pulmonary nodule - Primary    CT in 10/2019 with new location for nodule and other nodules not mentioned. Discussed that overall lower risk, but patient would like to meet with pulmonology to discuss further.       Relevant Orders   Ambulatory referral to Pulmonology   Erectile dysfunction    Pt seeing cardiology for work-up of cp. Discussed that tobacco could be factoring in and he is working on quitting. Advised completing cardiology work and up pending results and discussion with cardiology will provide prescription -- will hold off now due to increase risk for heart attack with medication          Return in about 3 months (around 03/01/2020).  Lesleigh Noe, MD

## 2019-12-03 NOTE — Assessment & Plan Note (Signed)
CT in 10/2019 with new location for nodule and other nodules not mentioned. Discussed that overall lower risk, but patient would like to meet with pulmonology to discuss further.

## 2019-12-03 NOTE — Assessment & Plan Note (Signed)
Pt seeing cardiology for work-up of cp. Discussed that tobacco could be factoring in and he is working on quitting. Advised completing cardiology work and up pending results and discussion with cardiology will provide prescription -- will hold off now due to increase risk for heart attack with medication

## 2019-12-03 NOTE — Assessment & Plan Note (Signed)
BP elevated. Discussed home monitoring, weight loss, exercise. Pt just quit smoking. Return in 3 months.

## 2019-12-10 ENCOUNTER — Ambulatory Visit (INDEPENDENT_AMBULATORY_CARE_PROVIDER_SITE_OTHER): Payer: BC Managed Care – PPO

## 2019-12-10 ENCOUNTER — Telehealth: Payer: Self-pay | Admitting: Cardiology

## 2019-12-10 ENCOUNTER — Other Ambulatory Visit: Payer: Self-pay

## 2019-12-10 DIAGNOSIS — R079 Chest pain, unspecified: Secondary | ICD-10-CM

## 2019-12-10 NOTE — Telephone Encounter (Signed)
Call to patient to clarify question.   Pt had echo today and is eager to hear results so that he can discuss resuming ED medication. I told patient it usually takes a day or 2 to process the results. He verbalized understanding and had no further questions at this time.   Advised pt to call for any further questions or concerns.

## 2019-12-10 NOTE — Telephone Encounter (Signed)
Pt c/o medication issue:  1. Name of Medication: Viagra   2. How are you currently taking this medication (dosage and times per day)? PRN  3. Are you having a reaction (difficulty breathing--STAT)? no  4. What is your medication issue? Just had echo and wants to know if safe to take viagra

## 2019-12-23 ENCOUNTER — Other Ambulatory Visit (HOSPITAL_COMMUNITY): Payer: Self-pay | Admitting: Emergency Medicine

## 2019-12-23 ENCOUNTER — Encounter (HOSPITAL_COMMUNITY): Payer: Self-pay

## 2019-12-23 ENCOUNTER — Telehealth (HOSPITAL_COMMUNITY): Payer: Self-pay | Admitting: Emergency Medicine

## 2019-12-23 DIAGNOSIS — R079 Chest pain, unspecified: Secondary | ICD-10-CM

## 2019-12-23 MED ORDER — METOPROLOL TARTRATE 100 MG PO TABS: ORAL_TABLET | ORAL | 0 refills | Status: DC

## 2019-12-23 NOTE — Telephone Encounter (Signed)
Left message on voicemail with name and callback number Breeze Angell RN Navigator Cardiac Imaging [facility] [phone] Office [phone] Cell  

## 2019-12-24 ENCOUNTER — Other Ambulatory Visit: Payer: Self-pay

## 2019-12-24 ENCOUNTER — Ambulatory Visit
Admission: RE | Admit: 2019-12-24 | Discharge: 2019-12-24 | Disposition: A | Discharge status: Home / Self Care | Payer: BC Managed Care – PPO | Source: Ambulatory Visit | Attending: Cardiology | Admitting: Cardiology

## 2019-12-24 DIAGNOSIS — R072 Precordial pain: Secondary | ICD-10-CM | POA: Insufficient documentation

## 2019-12-24 MED ORDER — NITROGLYCERIN 0.4 MG SL SUBL
0.8000 mg | SUBLINGUAL_TABLET | Freq: Once | SUBLINGUAL | Status: AC
  Administered 2019-12-24: 14:00:00 0.8 mg via SUBLINGUAL

## 2019-12-24 MED ORDER — IOHEXOL 350 MG/ML SOLN
125.0000 mL | Freq: Once | INTRAVENOUS | Status: AC | PRN
  Administered 2019-12-24: 125 mL via INTRAVENOUS

## 2019-12-24 MED ORDER — IOHEXOL 350 MG/ML SOLN: 100.0000 mL | Freq: Once | INTRAVENOUS | Status: DC | PRN

## 2019-12-24 NOTE — Progress Notes (Signed)
   12/24/19 1350  Vital Signs  ECG Heart Rate 62  Resp 18  BP 105/69  Oxygen Therapy  SpO2 96 %  O2 Device Room Air   Patient feels fine after CT cardiac exam. Denies headache/dizziness. Snack given.

## 2019-12-25 DIAGNOSIS — R072 Precordial pain: Secondary | ICD-10-CM | POA: Diagnosis not present

## 2020-01-04 ENCOUNTER — Ambulatory Visit: Payer: BC Managed Care – PPO | Admitting: Cardiology

## 2020-01-05 ENCOUNTER — Institutional Professional Consult (permissible substitution): Payer: BC Managed Care – PPO | Admitting: Pulmonary Disease

## 2020-01-08 ENCOUNTER — Encounter: Payer: Self-pay | Admitting: Cardiology

## 2020-01-08 ENCOUNTER — Ambulatory Visit (INDEPENDENT_AMBULATORY_CARE_PROVIDER_SITE_OTHER): Payer: BC Managed Care – PPO | Admitting: Cardiology

## 2020-01-08 ENCOUNTER — Other Ambulatory Visit: Payer: Self-pay

## 2020-01-08 VITALS — BP 140/88 | HR 82 | Ht 75.0 in | Wt 251.1 lb

## 2020-01-08 DIAGNOSIS — R079 Chest pain, unspecified: Secondary | ICD-10-CM

## 2020-01-08 DIAGNOSIS — Z720 Tobacco use: Secondary | ICD-10-CM | POA: Diagnosis not present

## 2020-01-08 NOTE — Progress Notes (Signed)
Cardiology Office Note:    Date:  01/08/2020   ID:  [name], DOB 04/23/1976, MRN [id number]  PCP:  Lesleigh Noe, MD  Cardiologist:  Kate Sable, MD  Electrophysiologist:  None   Referring MD: Lesleigh Noe, MD   Chief Complaint  Patient presents with  . office visit    6 week F/U after cardiac testing; Meds verbally reviewed with patient.    History of Present Illness:    Frank Stevens is a 44 y.o. male with a hx of asthma, GERD, current smoker x30 years who presents for follow-up.  He was last seen due to a 1 month history of chest pain.  Chest pain occurs with exertion or brisk walk and improves with rest.  Patient was at work when he first experienced severe chest discomfort while walking.  He presented to the ED on 11/02/2019 for chest pain.  High-sensitivity troponins were normal, CT angiogram was negative for PE.  EKG did not show any ST changes.    Echocardiogram and coronary CTA was ordered to evaluate chest discomfort.  He is scheduled to see pulmonary medicine due to his long history of smoking.  Past Medical History:  Diagnosis Date  . Asthma    childhood  . GERD (gastroesophageal reflux disease)   . Hepatitis    age 54  . Pre-diabetes   . Pulmonary nodule 12/17/2018   needs follow up CT in 1 year    Past Surgical History:  Procedure Laterality Date  . GANGLION CYST EXCISION Left   . lymph node     lymph node removed under left arm due to cat scratch fever. as a kid    Current Medications: Current Meds  Medication Sig  . albuterol (VENTOLIN HFA) 108 (90 Base) MCG/ACT inhaler Inhale 2 puffs into the lungs every 6 (six) hours as needed for wheezing or shortness of breath.  . sildenafil (REVATIO) 20 MG tablet Take 20 mg by mouth as needed. Take 3 tablets PO PRN  . valACYclovir (VALTREX) 500 MG tablet Take 1 tablet (500 mg total) by mouth daily.     Allergies:   Penicillins   Social History   Socioeconomic History  . Marital status: Single   Spouse name: Anderson Malta  . Number of children: 1  . Years of education: GED, associate degree  . Highest education level: Not on file  Occupational History  . Not on file  Tobacco Use  . Smoking status: Light Tobacco Smoker    Packs/day: 0.50    Years: 30.00    Pack years: 15.00    Types: Cigarettes    Last attempt to quit: 11/26/2019    Years since quitting: 0.1  . Smokeless tobacco: Former Systems developer    Types: Chew  Substance and Sexual Activity  . Alcohol use: Yes    Comment: 1-2 times per year  . Drug use: Not Currently  . Sexual activity: Yes    Birth control/protection: Pill  Other Topics Concern  . 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 Difficulty of Paying Living Expenses:   Food Insecurity:   . Worried About Running  Out of Food in the Last Year:   . [place] in the Last Year:   Transportation Needs:   . Lack of Transportation (Medical):   Marland Kitchen Lack of Transportation (Non-Medical):   Physical Activity:   . Days of Exercise per Week:   . Minutes of Exercise per Session:   Stress:   . Feeling of Stress :   Social Connections:   . Frequency of Communication with Friends and Family:   . Frequency of Social Gatherings with Friends and Family:   . Attends Religious Services:   . Active Member of Clubs or Organizations:   . Attends Archivist Meetings:   Marland Kitchen Marital Status:      Family History: The patient's family history includes Alcohol abuse in his father; Arthritis in his mother; Asthma in his sister; COPD in his mother; Cirrhosis in his father; Colon cancer (age of onset: 75) in his mother; Congestive Heart Failure in his mother; Diabetes in his mother; Drug abuse in his  father; Early death in his father; Heart disease in his mother; Kidney disease in his father.  ROS:   Please see the history of present illness.     All other systems reviewed and are negative.  EKGs/Labs/Other Studies Reviewed:    The following studies were reviewed today:   EKG:  EKG is  ordered today.  The ekg ordered today demonstrates normal sinus rhythm, normal ECG  Recent Labs: 11/02/2019: ALT 20; BUN 17; Creatinine, Ser 0.89; Hemoglobin 17.4; Platelets 390; Potassium 4.6; Sodium 137  Recent Lipid Panel    Component Value Date/Time   CHOL 178 02/22/2017 0000   TRIG 237 (A) 02/22/2017 0000   HDL 28 (A) 02/22/2017 0000   LDLCALC 103 02/22/2017 0000    Physical Exam:    VS:  BP 140/88 (BP Location: Left Arm, Patient Position: Sitting, Cuff Size: Normal)   Pulse 82   Ht 6\' 3"  (1.905 m)   Wt 251 lb 2 oz (113.9 kg)   SpO2 95%   BMI 31.39 kg/m     Wt Readings from Last 3 Encounters:  01/08/20 251 lb 2 oz (113.9 kg)  12/03/19 253 lb 8 oz (115 kg)  11/23/19 248 lb 12 oz (112.8 kg)     GEN:  Well nourished, well developed in no acute distress HEENT: Normal NECK: No JVD; No carotid bruits LYMPHATICS: No lymphadenopathy CARDIAC: RRR, no murmurs, rubs, gallops RESPIRATORY:  Clear to auscultation without rales, wheezing or rhonchi  ABDOMEN: Soft, non-tender, non-distended MUSCULOSKELETAL:  No edema; No deformity  SKIN: Warm and dry NEUROLOGIC:  Alert and oriented x 3 PSYCHIATRIC:  Normal affect   ASSESSMENT:    1. Chest pain of uncertain etiology   2. Tobacco use    PLAN:    In order of problems listed above:  1. Patient symptoms of chest pain are consistent with angina.  His risk factors include smoking.  Echocardiogram showed normal systolic and diastolic function.  EF 60 to 65%.  Coronary CTA showed a coronary calcium score of 41.  There was mild nonobstructive disease in the LAD .  CT FFR did not show any evidence for significant stenosis.  Patient reassured.   Patient is scheduled to see pulmonary medicine due to his long history of smoking. 2. Smoking cessation advised.    Follow-up as needed  This note was generated in part or whole with voice recognition software. Voice recognition is usually quite accurate but there are transcription errors that  can and very often do occur. I apologize for any typographical errors that were not detected and corrected.  Medication Adjustments/Labs and Tests Ordered: Current medicines are reviewed at length with the patient today.  Concerns regarding medicines are outlined above.  Orders Placed This Encounter  Procedures  . EKG 12-Lead   No orders of the defined types were placed in this encounter.   Patient Instructions  Medication Instructions:  Your physician recommends that you continue on your current medications as directed. Please refer to the Current Medication list given to you today.  *If you need a refill on your cardiac medications before your next appointment, please call your pharmacy*   Lab Work: None ordered If you have labs (blood work) drawn today and your tests are completely normal, you will receive your results only by: Marland Kitchen MyChart Message (if you have MyChart) OR . A paper copy in the mail If you have any lab test that is abnormal or we need to change your treatment, we will call you to review the results.   Testing/Procedures: None ordered   Follow-Up: At [facility], you and your health needs are our priority.  As part of our continuing mission to provide you with exceptional heart care, we have created designated Provider Care Teams.  These Care Teams include your primary Cardiologist (physician) and Advanced Practice Providers (APPs -  Physician Assistants and Nurse Practitioners) who all work together to provide you with the care you need, when you need it.  We recommend signing up for the patient portal called "MyChart".  Sign up information is provided on this After  Visit Summary.  MyChart is used to connect with patients for Virtual Visits (Telemedicine).  Patients are able to view lab/test results, encounter notes, upcoming appointments, etc.  Non-urgent messages can be sent to your provider as well.   To learn more about what you can do with MyChart, go to [email or web address].    Your next appointment:   As needed   The format for your next appointment:   In Person  Provider:    You may see Kate Sable, MD or one of the following Advanced Practice Providers on your designated Care Team:    Murray Hodgkins, NP  Christell Faith, PA-C  Marrianne Mood, PA-C    Other Instructions N/A     Signed, Kate Sable, MD  01/08/2020 11:31 AM    [place]

## 2020-01-08 NOTE — Patient Instructions (Signed)
Medication Instructions:  Your physician recommends that you continue on your current medications as directed. Please refer to the Current Medication list given to you today.  *If you need a refill on your cardiac medications before your next appointment, please call your pharmacy*   Lab Work: None ordered If you have labs (blood work) drawn today and your tests are completely normal, you will receive your results only by: . MyChart Message (if you have MyChart) OR . A paper copy in the mail If you have any lab test that is abnormal or we need to change your treatment, we will call you to review the results.   Testing/Procedures: None ordered   Follow-Up: At [facility], you and your health needs are our priority.  As part of our continuing mission to provide you with exceptional heart care, we have created designated Provider Care Teams.  These Care Teams include your primary Cardiologist (physician) and Advanced Practice Providers (APPs -  Physician Assistants and Nurse Practitioners) who all work together to provide you with the care you need, when you need it.  We recommend signing up for the patient portal called "MyChart".  Sign up information is provided on this After Visit Summary.  MyChart is used to connect with patients for Virtual Visits (Telemedicine).  Patients are able to view lab/test results, encounter notes, upcoming appointments, etc.  Non-urgent messages can be sent to your provider as well.   To learn more about what you can do with MyChart, go to [email or web address].    Your next appointment:   As needed   The format for your next appointment:   In Person  Provider:    You may see Brian Agbor-Etang, MD   or one of the following Advanced Practice Providers on your designated Care Team:    Christopher Berge, NP  Ryan Dunn, PA-C  Jacquelyn Visser, PA-C    Other Instructions N/A  

## 2020-02-24 ENCOUNTER — Encounter: Payer: Self-pay | Admitting: Pulmonary Disease

## 2020-02-24 ENCOUNTER — Ambulatory Visit (INDEPENDENT_AMBULATORY_CARE_PROVIDER_SITE_OTHER): Payer: BC Managed Care – PPO | Admitting: Pulmonary Disease

## 2020-02-24 ENCOUNTER — Other Ambulatory Visit: Payer: Self-pay

## 2020-02-24 VITALS — BP 122/80 | HR 78 | Ht 75.0 in | Wt 250.6 lb

## 2020-02-24 DIAGNOSIS — J449 Chronic obstructive pulmonary disease, unspecified: Secondary | ICD-10-CM

## 2020-02-24 DIAGNOSIS — R911 Solitary pulmonary nodule: Secondary | ICD-10-CM | POA: Diagnosis not present

## 2020-02-24 DIAGNOSIS — K219 Gastro-esophageal reflux disease without esophagitis: Secondary | ICD-10-CM

## 2020-02-24 DIAGNOSIS — R0789 Other chest pain: Secondary | ICD-10-CM | POA: Diagnosis not present

## 2020-02-24 DIAGNOSIS — R0989 Other specified symptoms and signs involving the circulatory and respiratory systems: Secondary | ICD-10-CM

## 2020-02-24 DIAGNOSIS — F17210 Nicotine dependence, cigarettes, uncomplicated: Secondary | ICD-10-CM

## 2020-02-24 MED ORDER — SPIRIVA RESPIMAT 2.5 MCG/ACT IN AERS: 2.0000 | INHALATION_SPRAY | Freq: Every day | RESPIRATORY_TRACT | 0 refills | Status: DC

## 2020-02-24 MED ORDER — PANTOPRAZOLE SODIUM 40 MG PO TBEC: 40.0000 mg | DELAYED_RELEASE_TABLET | Freq: Every day | ORAL | 2 refills | Status: DC

## 2020-02-24 NOTE — Progress Notes (Signed)
Subjective:    Patient ID: Frank Stevens, male    DOB: August 01, 1976, 44 y.o.   MRN: [id number]  HPI This is a 44 year old current smoker (1.5 PPD) who presents for evaluation of a lung nodule and noncardiac chest pain.  He is kindly referred by Waunita Schooner, MD. The patient has been having some issues with left-sided chest pain since January 2021.  He had CT angio chest on 02 November 2019 that showed a minute 4 mm nodule in the right middle lobe seen in series 7 image 71/168.  I have reviewed the films independently and with the patient.  Patient does not have any hemoptysis.  He has morning cough productive of clear sputum.  No fevers, chills or sweats.  He has had intentional weight loss states that he used to weight more than 300 pounds by this time last year.  He is down to 250 pounds.  The patient states that his chest pain is on the left upper chest and usually is aggravated by smoking.  He does have issues with gastroesophageal reflux.  He does not relate that anything in particular helps him, he has tried an albuterol inhaler with mixed results with regards to the chest pain.  Sometimes he feels it may help him other times does not.  He does state that albuterol makes his heart race and he tries to avoid it.  He has not had any orthopnea, paroxysmal nocturnal dyspnea or diaphoresis.  No lower extremity edema.  Past medical history, surgical history and social history have been reviewed and they are as noted.  Social history he smokes 1-1/2 packs of cigarettes per day (recently decreased from 2 packs/day) he has been smoking for 27 years.  He works in the [employer] as a Librarian, academic.  He has no exotic pets or birds in the home.  Has 1 dog in the home.  Has frequent TB testing through his work has been negative.  No military time.  Immunization History  Administered Date(s) Administered  . Influenza,inj,Quad PF,6+ Mos 07/30/2019  . Tdap 04/22/2019   Current Meds  Medication Sig  . albuterol  (VENTOLIN HFA) 108 (90 Base) MCG/ACT inhaler Inhale 2 puffs into the lungs every 6 (six) hours as needed for wheezing or shortness of breath.  . metoprolol tartrate (LOPRESSOR) 100 MG tablet Take 1 tablet (100 mg) by mouth x 1 dose, 2 hours prior to your Cardiac CT  . sildenafil (REVATIO) 20 MG tablet Take 20 mg by mouth as needed. Take 3 tablets PO PRN  . valACYclovir (VALTREX) 500 MG tablet Take 1 tablet (500 mg total) by mouth daily.    Review of Systems A 10 point review of systems was performed and it is as noted above otherwise negative.    Objective:   Physical Exam BP 122/80 (BP Location: Left Arm)   Pulse 78   Ht 6\' 3"  (1.905 m)   Wt 250 lb 9.6 oz (113.7 kg)   SpO2 97%   BMI 31.32 kg/m  GENERAL: Well-developed overweight gentleman, no acute respiratory distress.  Fully ambulatory.  Awake, alert, fully oriented. HEAD: Normocephalic, atraumatic.  EYES: Pupils equal, round, reactive to light.  No scleral icterus.  MOUTH: Nose/mouth/throat not examined due to masking requirements for COVID 19. NECK: Supple. No thyromegaly. No nodules. No JVD.  Trachea midline.  No crepitus PULMONARY: Good air entry bilaterally, coarse breath sounds, no other adventitious sounds. CARDIOVASCULAR: S1 and S2. Regular rate and rhythm.  No rubs murmurs gallops heard.  GASTROINTESTINAL: Benign. MUSCULOSKELETAL: No joint deformity, no clubbing, no edema.  NEUROLOGIC: Awake, alert, fully oriented.  No overt focal deficits noted.  Speech is fluent. SKIN: Intact,warm,dry.  Facial rubor noted. PSYCH: Mood and behavior appropriate.   Representative slice of the patient's CT scan of the chest performed showing the very small, 4 mm nodule in the right middle lobe peripherally.  0  Assessment & Plan:  Solitary pulmonary nodule 4 mm in diameter, right middle lobe Follow-up chest CT 1 year (January 2022)  Noncardiac pain GERD, poorly controlled Suspect cigarette smoke aggravates his GERD Recommended  engaging in smoking cessation Antireflux measures Trial of Protonix 40 mg daily  Cough COPD adjusted by initial evaluation Pulmonary function testing Trial of Spiriva 2.5 mcg, 2 inhalations daily  Tobacco dependence due to cigarettes Has failed multiple modalities of discontinuing to include Chantix and patches Discussed other strategies for discontinuation of smoking Total counseling time 3 to 5 minutes  Meds ordered this encounter  Medications  . pantoprazole (PROTONIX) 40 MG tablet    Sig: Take 1 tablet (40 mg total) by mouth daily.    Dispense:  30 tablet    Refill:  2  . Tiotropium Bromide Monohydrate (SPIRIVA RESPIMAT) 2.5 MCG/ACT AERS    Sig: Inhale 2 puffs into the lungs daily.    Dispense:  4 g    Refill:  0    Order Specific Question:   Lot Number?    Answer:   [id number]    Order Specific Question:   Expiration Date?    Answer:   08/29/2021    Order Specific Question:   Quantity    Answer:   2   C. Derrill Kay, MD [facility]  *This note was dictated using voice recognition software/Dragon.  Despite best efforts to proofread, errors can occur which can change the meaning.  Any change was purely unintentional.

## 2020-02-24 NOTE — Patient Instructions (Addendum)
We will order some breathing tests  We also are going to order a chest CT for January 2022 follow-up on your very small lung nodule  I am giving you a trial Spiriva, 2 puffs once a day let us know if this works for you  Giving you a trial of Protonix 1 tablet daily for reflux  We will see you in follow-up in 3 months time call sooner should any new difficulties arise

## 2020-02-29 ENCOUNTER — Encounter: Payer: Self-pay | Admitting: Family Medicine

## 2020-03-01 MED ORDER — SILDENAFIL CITRATE 20 MG PO TABS: ORAL_TABLET | ORAL | 1 refills | Status: DC

## 2020-03-15 ENCOUNTER — Other Ambulatory Visit
Admission: RE | Admit: 2020-03-15 | Discharge: 2020-03-15 | Disposition: A | Discharge status: Home / Self Care | Payer: BC Managed Care – PPO | Source: Ambulatory Visit | Attending: Pulmonary Disease | Admitting: Pulmonary Disease

## 2020-03-15 DIAGNOSIS — Z01812 Encounter for preprocedural laboratory examination: Secondary | ICD-10-CM | POA: Diagnosis not present

## 2020-03-15 DIAGNOSIS — Z20822 Contact with and (suspected) exposure to covid-19: Secondary | ICD-10-CM | POA: Diagnosis not present

## 2020-03-16 LAB — SARS CORONAVIRUS 2 (TAT 6-24 HRS): SARS Coronavirus 2: NEGATIVE

## 2020-03-17 ENCOUNTER — Ambulatory Visit (INDEPENDENT_AMBULATORY_CARE_PROVIDER_SITE_OTHER): Payer: BC Managed Care – PPO | Admitting: Internal Medicine

## 2020-03-17 ENCOUNTER — Other Ambulatory Visit: Payer: Self-pay

## 2020-03-17 DIAGNOSIS — R0789 Other chest pain: Secondary | ICD-10-CM

## 2020-03-17 DIAGNOSIS — R911 Solitary pulmonary nodule: Secondary | ICD-10-CM | POA: Diagnosis not present

## 2020-03-17 LAB — PULMONARY FUNCTION TEST
DL/VA % pred: 94 %
DL/VA: 4.21 ml/min/mmHg/L
DLCO cor % pred: 85 %
DLCO cor: 29.07 ml/min/mmHg
DLCO unc % pred: 85 %
DLCO unc: 29.07 ml/min/mmHg
FEF 25-75 Post: 4.18 L/sec
FEF 25-75 Pre: 4.1 L/sec
FEF2575-%Change-Post: 2 %
FEF2575-%Pred-Post: 103 %
FEF2575-%Pred-Pre: 100 %
FEV1-%Change-Post: 3 %
FEV1-%Pred-Post: 91 %
FEV1-%Pred-Pre: 88 %
FEV1-Post: 4.17 L
FEV1-Pre: 4.04 L
FEV1FVC-%Change-Post: 0 %
FEV1FVC-%Pred-Pre: 102 %
FEV6-%Change-Post: 3 %
FEV6-%Pred-Post: 91 %
FEV6-%Pred-Pre: 88 %
FEV6-Post: 5.19 L
FEV6-Pre: 5.04 L
FEV6FVC-%Change-Post: 0 %
FEV6FVC-%Pred-Post: 103 %
FEV6FVC-%Pred-Pre: 102 %
FVC-%Change-Post: 2 %
FVC-%Pred-Post: 88 %
FVC-%Pred-Pre: 86 %
FVC-Post: 5.19 L
FVC-Pre: 5.05 L
Post FEV1/FVC ratio: 80 %
Post FEV6/FVC ratio: 100 %
Pre FEV1/FVC ratio: 80 %
Pre FEV6/FVC Ratio: 100 %
RV % pred: 79 %
RV: 1.72 L
TLC % pred: 88 %
TLC: 6.88 L

## 2020-03-17 NOTE — Progress Notes (Signed)
PFT done today. 

## 2020-04-26 NOTE — Telephone Encounter (Signed)
Pt had visit 11/06/19 as FU.

## 2020-06-02 ENCOUNTER — Encounter: Payer: Self-pay | Admitting: Family Medicine

## 2020-08-04 ENCOUNTER — Other Ambulatory Visit: Payer: Self-pay | Admitting: Family Medicine

## 2020-08-04 DIAGNOSIS — B009 Herpesviral infection, unspecified: Secondary | ICD-10-CM

## 2020-08-05 NOTE — Telephone Encounter (Signed)
Last office visit 12/03/2019 for pulmonary nodule.  Last refilled 08/31/2019 for #90 with 3 refills.  No future appointments.

## 2020-09-01 ENCOUNTER — Encounter: Payer: Self-pay | Admitting: Family Medicine

## 2020-09-06 ENCOUNTER — Other Ambulatory Visit: Payer: Self-pay

## 2020-09-06 ENCOUNTER — Encounter: Payer: Self-pay | Admitting: Family Medicine

## 2020-09-06 ENCOUNTER — Ambulatory Visit (INDEPENDENT_AMBULATORY_CARE_PROVIDER_SITE_OTHER): Payer: BC Managed Care – PPO | Admitting: Family Medicine

## 2020-09-06 VITALS — BP 130/80 | HR 87 | Temp 98.2°F | Ht 75.0 in | Wt 258.2 lb

## 2020-09-06 DIAGNOSIS — I10 Essential (primary) hypertension: Secondary | ICD-10-CM

## 2020-09-06 DIAGNOSIS — Z720 Tobacco use: Secondary | ICD-10-CM

## 2020-09-06 DIAGNOSIS — R7303 Prediabetes: Secondary | ICD-10-CM | POA: Diagnosis not present

## 2020-09-06 DIAGNOSIS — E669 Obesity, unspecified: Secondary | ICD-10-CM

## 2020-09-06 DIAGNOSIS — J449 Chronic obstructive pulmonary disease, unspecified: Secondary | ICD-10-CM | POA: Insufficient documentation

## 2020-09-06 DIAGNOSIS — Z0000 Encounter for general adult medical examination without abnormal findings: Secondary | ICD-10-CM

## 2020-09-06 DIAGNOSIS — Z1159 Encounter for screening for other viral diseases: Secondary | ICD-10-CM | POA: Diagnosis not present

## 2020-09-06 LAB — CBC
HCT: 49.7 % (ref 39.0–52.0)
Hemoglobin: 17.3 g/dL — ABNORMAL HIGH (ref 13.0–17.0)
MCHC: 34.8 g/dL (ref 30.0–36.0)
MCV: 95.8 fl (ref 78.0–100.0)
Platelets: 419 10*3/uL — ABNORMAL HIGH (ref 150.0–400.0)
RBC: 5.18 Mil/uL (ref 4.22–5.81)
RDW: 13.8 % (ref 11.5–15.5)
WBC: 11.1 10*3/uL — ABNORMAL HIGH (ref 4.0–10.5)

## 2020-09-06 LAB — COMPREHENSIVE METABOLIC PANEL
ALT: 21 U/L (ref 0–53)
AST: 17 U/L (ref 0–37)
Albumin: 4.4 g/dL (ref 3.5–5.2)
Alkaline Phosphatase: 69 U/L (ref 39–117)
BUN: 16 mg/dL (ref 6–23)
CO2: 29 mEq/L (ref 19–32)
Calcium: 9.2 mg/dL (ref 8.4–10.5)
Chloride: 104 mEq/L (ref 96–112)
Creatinine, Ser: 1.01 mg/dL (ref 0.40–1.50)
GFR: 89.13 mL/min (ref >60.00)
Glucose, Bld: 102 mg/dL — ABNORMAL HIGH (ref 70–99)
Potassium: 4.2 mEq/L (ref 3.5–5.1)
Sodium: 140 mEq/L (ref 135–145)
Total Bilirubin: 0.3 mg/dL (ref 0.2–1.2)
Total Protein: 7.3 g/dL (ref 6.0–8.3)

## 2020-09-06 LAB — LIPID PANEL
Cholesterol: 191 mg/dL (ref 0–200)
HDL: 28.9 mg/dL — ABNORMAL LOW (ref >39.00)
Total CHOL/HDL Ratio: 7
Triglycerides: 475 mg/dL — ABNORMAL HIGH (ref 0.0–149.0)

## 2020-09-06 LAB — HEMOGLOBIN A1C: Hgb A1c MFr Bld: 6 % (ref 4.6–6.5)

## 2020-09-06 LAB — LDL CHOLESTEROL, DIRECT: Direct LDL: 129 mg/dL

## 2020-09-06 MED ORDER — CHANTIX STARTING MONTH PAK 0.5 MG X 11 & 1 MG X 42 PO TABS: ORAL_TABLET | ORAL | 0 refills | Status: DC

## 2020-09-06 MED ORDER — SPIRIVA RESPIMAT 2.5 MCG/ACT IN AERS
2.0000 | INHALATION_SPRAY | Freq: Every day | RESPIRATORY_TRACT | 3 refills | Status: DC
Start: 2020-09-06 — End: 2020-12-01

## 2020-09-06 MED ORDER — ALBUTEROL SULFATE HFA 108 (90 BASE) MCG/ACT IN AERS: 2.0000 | INHALATION_SPRAY | Freq: Four times a day (QID) | RESPIRATORY_TRACT | 1 refills | Status: DC | PRN

## 2020-09-06 NOTE — Assessment & Plan Note (Signed)
Healthy diet, repeat labs

## 2020-09-06 NOTE — Assessment & Plan Note (Signed)
Would like to try chantix again. Discussed risks. Prescription provided. Will consider welbutrin if he experiences side effects again. Check by via Mychart in 4 weeks

## 2020-09-06 NOTE — Patient Instructions (Signed)
Preventive Care 80-46 Years Old, Male Preventive care refers to lifestyle choices and visits with your health care provider that can promote health and wellness. This includes:  A yearly physical exam. This is also called an annual well check.  Regular dental and eye exams.  Immunizations.  Screening for certain conditions.  Healthy lifestyle choices, such as eating a healthy diet, getting regular exercise, not using drugs or products that contain nicotine and tobacco, and limiting alcohol use. What can I expect for my preventive care visit? Physical exam Your health care provider will check:  Height and weight. These may be used to calculate body mass index (BMI), which is a measurement that tells if you are at a healthy weight.  Heart rate and blood pressure.  Your skin for abnormal spots. Counseling Your health care provider may ask you questions about:  Alcohol, tobacco, and drug use.  Emotional well-being.  Home and relationship well-being.  Sexual activity.  Eating habits.  Work and work Statistician. What immunizations do I need?  Influenza (flu) vaccine  This is recommended every year. Tetanus, diphtheria, and pertussis (Tdap) vaccine  You may need a Td booster every 10 years. Varicella (chickenpox) vaccine  You may need this vaccine if you have not already been vaccinated. Zoster (shingles) vaccine  You may need this after age 46. Measles, mumps, and rubella (MMR) vaccine  You may need at least one dose of MMR if you were born in 1957 or later. You may also need a second dose. Pneumococcal conjugate (PCV13) vaccine  You may need this if you have certain conditions and were not previously vaccinated. Pneumococcal polysaccharide (PPSV23) vaccine  You may need one or two doses if you smoke cigarettes or if you have certain conditions. Meningococcal conjugate (MenACWY) vaccine  You may need this if you have certain conditions. Hepatitis A  vaccine  You may need this if you have certain conditions or if you travel or work in places where you may be exposed to hepatitis A. Hepatitis B vaccine  You may need this if you have certain conditions or if you travel or work in places where you may be exposed to hepatitis B. Haemophilus influenzae type b (Hib) vaccine  You may need this if you have certain risk factors. Human papillomavirus (HPV) vaccine  If recommended by your health care provider, you may need three doses over 6 months. You may receive vaccines as individual doses or as more than one vaccine together in one shot (combination vaccines). Talk with your health care provider about the risks and benefits of combination vaccines. What tests do I need? Blood tests  Lipid and cholesterol levels. These may be checked every 5 years, or more frequently if you are over 71 years old.  Hepatitis C test.  Hepatitis B test. Screening  Lung cancer screening. You may have this screening every year starting at age 46 if you have a 30-pack-year history of smoking and currently smoke or have quit within the past 15 years.  Prostate cancer screening. Recommendations will vary depending on your family history and other risks.  Colorectal cancer screening. All adults should have this screening starting at age 46 and continuing until age 23. Your health care provider may recommend screening at age 46 if you are at increased risk. You will have tests every 1-10 years, depending on your results and the type of screening test.  Diabetes screening. This is done by checking your blood sugar (glucose) after you have not eaten  for a while (fasting). You may have this done every 1-3 years.  Sexually transmitted disease (STD) testing. Follow these instructions at home: Eating and drinking  Eat a diet that includes fresh fruits and vegetables, whole grains, lean protein, and low-fat dairy products.  Take vitamin and mineral supplements as  recommended by your health care provider.  Do not drink alcohol if your health care provider tells you not to drink.  If you drink alcohol: ? Limit how much you have to 0-2 drinks a day. ? Be aware of how much alcohol is in your drink. In the [place] one drink equals one 12 oz bottle of beer (355 mL), one 5 oz glass of wine (148 mL), or one 1 oz glass of hard liquor (44 mL). Lifestyle  Take daily care of your teeth and gums.  Stay active. Exercise for at least 30 minutes on 5 or more days each week.  Do not use any products that contain nicotine or tobacco, such as cigarettes, e-cigarettes, and chewing tobacco. If you need help quitting, ask your health care provider.  If you are sexually active, practice safe sex. Use a condom or other form of protection to prevent STIs (sexually transmitted infections).  Talk with your health care provider about taking a low-dose aspirin every day starting at age 46. What's next?  Go to your health care provider once a year for a well check visit.  Ask your health care provider how often you should have your eyes and teeth checked.  Stay up to date on all vaccines. This information is not intended to replace advice given to you by your health care provider. Make sure you discuss any questions you have with your health care provider. Document Revised: 10/09/2018 Document Reviewed: 10/09/2018 Elsevier Patient Education  2020 [employer].

## 2020-09-06 NOTE — Assessment & Plan Note (Signed)
Normal w/o medication.

## 2020-09-06 NOTE — Progress Notes (Signed)
Annual Exam   Chief Complaint:  Chief Complaint  Patient presents with  . Annual Exam    wants to discuss chantix     History of Present Illness:  Frank Stevens is a 44 y.o. presents today for annual examination.     Nutrition/Lifestyle Diet: not great - working 2 jobs - is trying to eat better Exercise: just work He is single partner, contraception - OCP (estrogen/progesterone).  Any issues with getting or keeping erection? No  Social History   Tobacco Use  Smoking Status Current Every Day Smoker  . Packs/day: 2.00  . Years: 30.00  . Pack years: 60.00  . Types: Cigarettes  Smokeless Tobacco Former Systems developer  . Types: Chew   Social History   Substance and Sexual Activity  Alcohol Use Yes   Comment: 1-2 times per year   Social History   Substance and Sexual Activity  Drug Use Not Currently     Safety The patient wears seatbelts: yes.     The patient feels safe at home and in their relationships: yes.  General Health Dentist in the last year: Yes Eye doctor: yes  Weight Wt Readings from Last 3 Encounters:  09/06/20 258 lb 4 oz (117.1 kg)  02/24/20 250 lb 9.6 oz (113.7 kg)  01/08/20 251 lb 2 oz (113.9 kg)   Patient has high BMI  BMI Readings from Last 1 Encounters:  09/06/20 32.28 kg/m     Chronic disease screening Blood pressure monitoring:  BP Readings from Last 3 Encounters:  09/06/20 130/80  02/24/20 122/80  01/08/20 140/88    Lipid Monitoring: Indication for screening: age >35, obesity, diabetes, family hx, CV risk factors.  Lipid screening: Yes  Lab Results  Component Value Date   CHOL 178 02/22/2017   HDL 28 (A) 02/22/2017   LDLCALC 103 02/22/2017   TRIG 237 (A) 02/22/2017     Diabetes Screening: age >73, overweight, family hx, PCOS, hx of gestational diabetes, at risk ethnicity, elevated blood pressure >135/80.  Diabetes Screening screening: Yes  Lab Results  Component Value Date   HGBA1C 5.9 02/22/2017     Prostate Cancer  Screening: Not Indicated Age 63-69 yo Shared Decision Making Higher Risk: Older age, African American, Family Hx of Prostate Cancer - No Benefits: screening may prevent 1.3 deaths from prostate cancer over 13 years per 1000 men screened and prevent 3 metastatic cases per 1000 men screened. Not enough evidence to support more benefit for AA or [place] Harms: False Positive and psychological harms. 15% of me with false positive over a 2 to 4 year period > resulting in biopsy and complications such as pain, hematospermia, infections. Overdiagnosis - increases with age - found that 20-50% of prostate cancer through screening may have never caused any issues. Harms of treatment include - erectile dysfunction, urinary incontinence, and bothersome bowel symptoms.   After discussion he does not want to get a PSA checked today.   Inadequate evidence for screening <55 No mortality benefit for screening >70   Lab Results  Component Value Date   PSA 1.2 02/22/2017       Colon Cancer Screening:  Age 72-75 yo - benefits outweigh the risk. Adults 10-85 yo who have never been screened benefit.  Benefits: 134000 people in 2016 will be diagnosed and 49,000 will die - early detection helps Harms: Complications 2/2 to colonoscopy High Risk (Colonoscopy): genetic disorder (Lynch syndrome or familial adenomatous polyposis), personal hx of IBD, previous adenomatous polyp, or previous colorectal cancer, FamHx  start 10 years before the age at diagnosis, increased in males and black race  Options:  FIT - looks for hemoglobin (blood in the stool) - specific and fairly sensitive - must be done annually Cologuard - looks for DNA and blood - more sensitive - therefore can have more false positives, every 3 years Colonoscopy - every 10 years if normal - sedation, bowl prep, must have someone drive you  Shared decision making and the patient had decided to do colonoscopy - 2020 - 7 year recall.   Social History    Tobacco Use  Smoking Status Current Every Day Smoker  . Packs/day: 2.00  . Years: 30.00  . Pack years: 60.00  . Types: Cigarettes  Smokeless Tobacco Former Systems developer  . Types: Chew    Lung Cancer Screening (Ages 47-80): no 20 year pack history? Yes Current Tobacco user? Yes Quit less than 15 years ago? No Interested in low dose CT for lung cancer screening? not applicable  Abdominal Aortic Aneurysm:  Age 98-75, 1 time screening, men who have ever smoked - will be eligible    Past Medical History:  Diagnosis Date  . Asthma    childhood  . GERD (gastroesophageal reflux disease)   . Hepatitis    age 23  . Pre-diabetes   . Pulmonary nodule 12/17/2018   needs follow up CT in 1 year    Past Surgical History:  Procedure Laterality Date  . GANGLION CYST EXCISION Left   . lymph node     lymph node removed under left arm due to cat scratch fever. as a kid    Prior to Admission medications   Medication Sig Start Date End Date Taking? Authorizing Provider  albuterol (VENTOLIN HFA) 108 (90 Base) MCG/ACT inhaler Inhale 2 puffs into the lungs every 6 (six) hours as needed for wheezing or shortness of breath. 11/02/19  Yes Merlyn Lot, MD  sildenafil (REVATIO) 20 MG tablet Take 3 tablets PO PRN 03/01/20  Yes Lesleigh Noe, MD  valACYclovir (VALTREX) 500 MG tablet TAKE 1 TABLET BY MOUTH EVERY DAY 08/05/20  Yes Lesleigh Noe, MD  valACYclovir (VALTREX) 500 MG tablet Take 1 tablet (500 mg total) by mouth daily. 08/31/19   Lesleigh Noe, MD    Allergies  Allergen Reactions  . Penicillins     Vomiting      Social History   Socioeconomic History  . Marital status: Single    Spouse name: Not on file  . Number of children: 1  . Years of education: GED, associate degree  . Highest education level: Not on file  Occupational History  . Not on file  Tobacco Use  . Smoking status: Current Every Day Smoker    Packs/day: 2.00    Years: 30.00    Pack years: 60.00    Types:  Cigarettes  . Smokeless tobacco: Former Systems developer    Types: Secondary school teacher  . Vaping Use: Former  . Start date: 02/27/2015  . Quit date: 10/30/2015  Substance and Sexual Activity  . Alcohol use: Yes    Comment: 1-2 times per year  . Drug use: Not Currently  . Sexual activity: Yes    Birth control/protection: Pill  Other Topics Concern  . Not on file  Social History Narrative   Girlfriend - Elmo Putt - lives in [place] - 22 - just finished college   Enjoys - doesn't have a lot time for freetime   Works as  a Freight forwarder at [employer]   Exercise: works, not regularly - time is a limiting factor   Diet: not great   Social Determinants of Radio broadcast assistant Strain:   . Difficulty of Paying Living Expenses: Not on file  Food Insecurity:   . Worried About Charity fundraiser in the Last Year: Not on file  . Ran Out of Food in the Last Year: Not on file  Transportation Needs:   . Lack of Transportation (Medical): Not on file  . Lack of Transportation (Non-Medical): Not on file  Physical Activity:   . Days of Exercise per Week: Not on file  . Minutes of Exercise per Session: Not on file  Stress:   . Feeling of Stress : Not on file  Social Connections:   . Frequency of Communication with Friends and Family: Not on file  . Frequency of Social Gatherings with Friends and Family: Not on file  . Attends Religious Services: Not on file  . Active Member of Clubs or Organizations: Not on file  . Attends Archivist Meetings: Not on file  . Marital Status: Not on file  Intimate Partner Violence:   . Fear of Current or Ex-Partner: Not on file  . Emotionally Abused: Not on file  . Physically Abused: Not on file  . Sexually Abused: Not on file    Family History  Problem Relation Age of Onset  . Arthritis Mother   . COPD Mother   . Diabetes Mother   . Heart disease Mother   . Colon cancer Mother 66  . Congestive Heart Failure Mother   .  Alcohol abuse Father   . Drug abuse Father   . Early death Father   . Kidney disease Father   . Cirrhosis Father   . Asthma Sister     Review of Systems  Constitutional: Negative for chills and fever.  HENT: Negative for congestion and sore throat.   Eyes: Negative for blurred vision and double vision.  Respiratory: Negative for shortness of breath.   Cardiovascular: Positive for chest pain (smoking related).  Gastrointestinal: Negative for heartburn, nausea and vomiting.  Genitourinary: Negative.   Musculoskeletal: Positive for joint pain. Negative for myalgias.  Skin: Negative for rash.  Neurological: Negative for dizziness and headaches.  Endo/Heme/Allergies: Does not bruise/bleed easily.  Psychiatric/Behavioral: Negative for depression. The patient is not nervous/anxious.      Physical Exam BP 130/80   Pulse 87   Temp 98.2 F (36.8 C) (Temporal)   Ht 6\' 3"  (1.905 m)   Wt 258 lb 4 oz (117.1 kg)   SpO2 95%   BMI 32.28 kg/m    BP Readings from Last 3 Encounters:  09/06/20 130/80  02/24/20 122/80  01/08/20 140/88      Physical Exam Constitutional:      General: He is not in acute distress.    Appearance: He is well-developed. He is not diaphoretic.  HENT:     Head: Normocephalic and atraumatic.     Right Ear: Tympanic membrane and ear canal normal.     Left Ear: Tympanic membrane and ear canal normal.     Nose: Nose normal.     Mouth/Throat:     Pharynx: Uvula midline.  Eyes:     General: No scleral icterus.    Conjunctiva/sclera: Conjunctivae normal.     Pupils: Pupils are equal, round, and reactive to light.  Cardiovascular:     Rate and Rhythm:  Normal rate and regular rhythm.     Heart sounds: Normal heart sounds. No murmur heard.   Pulmonary:     Effort: Pulmonary effort is normal. No respiratory distress.     Breath sounds: Normal breath sounds. No wheezing.  Abdominal:     General: Bowel sounds are normal. There is no distension.     Palpations:  Abdomen is soft. There is no mass.     Tenderness: There is no abdominal tenderness. There is no guarding.  Musculoskeletal:        General: Normal range of motion.     Cervical back: Normal range of motion and neck supple.  Lymphadenopathy:     Cervical: No cervical adenopathy.  Skin:    General: Skin is warm and dry.     Capillary Refill: Capillary refill takes less than 2 seconds.  Neurological:     Mental Status: He is alert and oriented to person, place, and time.        Results:  PHQ-9:    Office Visit from 09/06/2020 in [place] at [facility]  PHQ-9 Total Score 8        Assessment: 44 y.o. here for routine annual physical examination.  Plan: Problem List Items Addressed This Visit      Cardiovascular and Mediastinum   Essential hypertension    Normal w/o medication.       Relevant Orders   Comprehensive metabolic panel   CBC     Respiratory   Chronic obstructive pulmonary disease ([place])    Restart spiriva to see if this improves his long functioning. Chantix for smoking cessation. Refill albuterol prn      Relevant Medications   varenicline (CHANTIX STARTING MONTH PAK) 0.5 MG X 11 & 1 MG X 42 tablet   albuterol (VENTOLIN HFA) 108 (90 Base) MCG/ACT inhaler   Tiotropium Bromide Monohydrate (SPIRIVA RESPIMAT) 2.5 MCG/ACT AERS     Other   Tobacco use    Would like to try chantix again. Discussed risks. Prescription provided. Will consider welbutrin if he experiences side effects again. Check by via Mychart in 4 weeks      Relevant Medications   varenicline (CHANTIX STARTING MONTH PAK) 0.5 MG X 11 & 1 MG X 42 tablet   Obesity (BMI 30-39.9)   Prediabetes    Healthy diet, repeat labs      Relevant Orders   Lipid panel   Hemoglobin A1c    Other Visit Diagnoses    Annual physical exam    -  Primary   Relevant Orders   Lipid panel   Need for hepatitis C screening test       Relevant Orders   Hepatitis C antibody      Screening: --  Blood pressure screen normal -- cholesterol screening: will obtain -- Weight screening: obese: discussed management options, including lifestyle, dietary, and exercise. -- Diabetes Screening: will obtain -- Nutrition: normal - Encouraged healthy diet  The ASCVD Risk score Mikey Bussing DC Jr., et al., 2013) failed to calculate for the following reasons:   Cannot find a previous HDL lab   Cannot find a previous total cholesterol lab  -- ASA 81 mg discussed if CVD risk >10% age 65-59 and willing to take for 10 years -- Statin therapy for Age 56-75 with CVD risk >7.5%  Psych -- Depression screening (PHQ-9): positive, but doing OK  Safety -- tobacco screening: starting chantix -- alcohol screening:  low-risk usage. -- no evidence of domestic violence or  intimate partner violence.  Cancer Screening -- Prostate (age 39-69) not indicated -- Colon (age 18-75) up to date -- Lung - lung nodules already being followed   Immunizations Immunization History  Administered Date(s) Administered  . Influenza,inj,Quad PF,6+ Mos 07/30/2019  . Tdap 04/22/2019    -- flu vaccine up to date - he will bring record -- TDAP q10 years up to date  -- PPSV-23 (19-64 with chronic disease or smoking) eligible, he will consider  -- Covid-19 Vaccine - he will upload via mychart  Encouraged regular vision and dental screening. Encouraged healthy exercise and diet.   Lesleigh Noe

## 2020-09-06 NOTE — Assessment & Plan Note (Signed)
Restart spiriva to see if this improves his long functioning. Chantix for smoking cessation. Refill albuterol prn

## 2020-09-07 ENCOUNTER — Encounter: Payer: Self-pay | Admitting: Family Medicine

## 2020-09-07 DIAGNOSIS — E782 Mixed hyperlipidemia: Secondary | ICD-10-CM

## 2020-09-07 DIAGNOSIS — Z720 Tobacco use: Secondary | ICD-10-CM

## 2020-09-07 LAB — HEPATITIS C ANTIBODY
Hepatitis C Ab: NONREACTIVE
SIGNAL TO CUT-OFF: 0.01 (ref <1.00)

## 2020-09-08 MED ORDER — ATORVASTATIN CALCIUM 10 MG PO TABS: 10.0000 mg | ORAL_TABLET | Freq: Every day | ORAL | 3 refills | Status: DC

## 2020-09-08 MED ORDER — BUPROPION HCL ER (SR) 150 MG PO TB12: ORAL_TABLET | ORAL | 3 refills | Status: DC

## 2020-09-08 NOTE — Addendum Note (Signed)
Addended by: Lesleigh Noe on: 09/08/2020 09:26 AM   Modules accepted: Orders

## 2020-09-13 MED ORDER — SILDENAFIL CITRATE 20 MG PO TABS: ORAL_TABLET | ORAL | 1 refills | Status: DC

## 2020-09-13 NOTE — Addendum Note (Signed)
Addended by: Lesleigh Noe on: 09/13/2020 09:48 AM   Modules accepted: Orders

## 2020-10-27 ENCOUNTER — Other Ambulatory Visit: Payer: Self-pay | Admitting: Family Medicine

## 2020-11-09 ENCOUNTER — Ambulatory Visit: Payer: BC Managed Care – PPO

## 2020-11-18 ENCOUNTER — Ambulatory Visit: Payer: BC Managed Care – PPO | Attending: Pulmonary Disease

## 2020-12-01 ENCOUNTER — Other Ambulatory Visit: Payer: Self-pay

## 2020-12-01 ENCOUNTER — Encounter: Payer: Self-pay | Admitting: Family Medicine

## 2020-12-01 ENCOUNTER — Ambulatory Visit (INDEPENDENT_AMBULATORY_CARE_PROVIDER_SITE_OTHER): Payer: BC Managed Care – PPO | Admitting: Family Medicine

## 2020-12-01 VITALS — BP 130/80 | HR 81 | Temp 98.7°F | Ht 75.0 in | Wt 258.0 lb

## 2020-12-01 DIAGNOSIS — R10819 Abdominal tenderness, unspecified site: Secondary | ICD-10-CM | POA: Diagnosis not present

## 2020-12-01 DIAGNOSIS — R911 Solitary pulmonary nodule: Secondary | ICD-10-CM

## 2020-12-01 DIAGNOSIS — E782 Mixed hyperlipidemia: Secondary | ICD-10-CM

## 2020-12-01 DIAGNOSIS — Z720 Tobacco use: Secondary | ICD-10-CM | POA: Diagnosis not present

## 2020-12-01 DIAGNOSIS — E785 Hyperlipidemia, unspecified: Secondary | ICD-10-CM | POA: Insufficient documentation

## 2020-12-01 NOTE — Assessment & Plan Note (Signed)
Etiology unclear ?hernia vs muscle strain. Advised NSAIDs/ice/heat. If not improving or worsening will get [place] to evaluate

## 2020-12-01 NOTE — Assessment & Plan Note (Signed)
Pt stopped lipitor due to diarrhea which has improved. Discussed increase for MI/Stroke. He will get repeat labs at work and continue to follow. Is working on [employer], weight loss, tobacco cessation.

## 2020-12-01 NOTE — Assessment & Plan Note (Signed)
Cutting back on smoking. Overdue for follow-up CT. He will schedule

## 2020-12-01 NOTE — Patient Instructions (Addendum)
Try voltaren gel or ice/heat  If not improvement or worsening -- call and can get [place]  Get repeat lung  If you get labs with work - send them to [facility] and vaccination card

## 2020-12-01 NOTE — Progress Notes (Signed)
Subjective:     Frank Stevens is a 45 y.o. male presenting for Groin Pain (Hurts when touched X "few weeks" )     HPI   #HLD - stopped atorvastatin 2/2 to diarrhea - modified diet - smoothie in the morning, protein bar/shake, eating some red meat, limited potatoes   #LLQ pain - point tenderness - GF nurse  - for 3-4 weeks - has awareness of discomfort 1/10 - but worse with palpation 6-7/10 - was having diarrhea but this is resolved - no n/v, no change with BM - no urinary symptoms - endorses gas - but not related to pain - no changes with erection   Review of Systems   Social History   Tobacco Use  Smoking Status Current Every Day Smoker  . Packs/day: 0.75  . Years: 30.00  . Pack years: 22.50  . Types: Cigarettes  Smokeless Tobacco Former Systems developer  . Types: Chew  Tobacco Comment   2 ppd x 30 years        Objective:    BP Readings from Last 3 Encounters:  12/01/20 130/80  09/06/20 130/80  02/24/20 122/80   Wt Readings from Last 3 Encounters:  12/01/20 258 lb (117 kg)  09/06/20 258 lb 4 oz (117.1 kg)  02/24/20 250 lb 9.6 oz (113.7 kg)    BP 130/80   Pulse 81   Temp 98.7 F (37.1 C) (Temporal)   Ht 6\' 3"  (1.905 m)   Wt 258 lb (117 kg)   SpO2 96%   BMI 32.25 kg/m    Physical Exam Constitutional:      Appearance: Normal appearance. He is not ill-appearing or diaphoretic.  HENT:     Right Ear: External ear normal.     Left Ear: External ear normal.     Nose: Nose normal.  Eyes:     General: No scleral icterus.    Extraocular Movements: Extraocular movements intact.     Conjunctiva/sclera: Conjunctivae normal.  Cardiovascular:     Rate and Rhythm: Normal rate.  Pulmonary:     Effort: Pulmonary effort is normal.  Abdominal:     General: Abdomen is flat. Bowel sounds are normal. There is no distension.     Palpations: Abdomen is soft.     Tenderness: There is no guarding or rebound.     Hernia: No hernia is present.    Musculoskeletal:      Cervical back: Neck supple.  Skin:    General: Skin is warm and dry.  Neurological:     Mental Status: He is alert. Mental status is at baseline.  Psychiatric:        Mood and Affect: Mood normal.           Assessment & Plan:   Problem List Items Addressed This Visit      Other   Tobacco use    Continued to encourage smoking cessation. Due for repeat CT to follow nodules. He will schedule      Pulmonary nodule    Cutting back on smoking. Overdue for follow-up CT. He will schedule      Suprapubic tenderness - Primary    Etiology unclear ?hernia vs muscle strain. Advised NSAIDs/ice/heat. If not improving or worsening will get [place] to evaluate      Hyperlipidemia    Pt stopped lipitor due to diarrhea which has improved. Discussed increase for MI/Stroke. He will get repeat labs at work and continue to follow. Is working on [employer], weight loss,  tobacco cessation.           Return in about 6 months (around 05/31/2021).  Lesleigh Noe, MD  This visit occurred during the SARS-CoV-2 public health emergency.  Safety protocols were in place, including screening questions prior to the visit, additional usage of staff PPE, and extensive cleaning of exam room while observing appropriate contact time as indicated for disinfecting solutions.

## 2020-12-01 NOTE — Assessment & Plan Note (Signed)
Continued to encourage smoking cessation. Due for repeat CT to follow nodules. He will schedule

## 2020-12-27 ENCOUNTER — Encounter: Payer: Self-pay | Admitting: Family Medicine

## 2020-12-27 DIAGNOSIS — Z113 Encounter for screening for infections with a predominantly sexual mode of transmission: Secondary | ICD-10-CM

## 2020-12-27 NOTE — Addendum Note (Signed)
Addended by: Lesleigh Noe on: 12/27/2020 05:09 PM   Modules accepted: Orders

## 2021-01-01 DIAGNOSIS — Z202 Contact with and (suspected) exposure to infections with a predominantly sexual mode of transmission: Secondary | ICD-10-CM | POA: Diagnosis not present

## 2021-01-01 DIAGNOSIS — Z7689 Persons encountering health services in other specified circumstances: Secondary | ICD-10-CM | POA: Diagnosis not present

## 2021-01-06 DIAGNOSIS — Z880 Allergy status to penicillin: Secondary | ICD-10-CM | POA: Diagnosis not present

## 2021-01-06 DIAGNOSIS — K0889 Other specified disorders of teeth and supporting structures: Secondary | ICD-10-CM | POA: Diagnosis not present

## 2021-01-06 DIAGNOSIS — Z79899 Other long term (current) drug therapy: Secondary | ICD-10-CM | POA: Diagnosis not present

## 2021-01-06 DIAGNOSIS — K0500 Acute gingivitis, plaque induced: Secondary | ICD-10-CM | POA: Diagnosis not present

## 2021-01-06 DIAGNOSIS — F17210 Nicotine dependence, cigarettes, uncomplicated: Secondary | ICD-10-CM | POA: Diagnosis not present

## 2021-01-06 DIAGNOSIS — K0510 Chronic gingivitis, plaque induced: Secondary | ICD-10-CM | POA: Diagnosis not present

## 2021-01-07 LAB — BASIC METABOLIC PANEL
BUN: 15 (ref 4–21)
CO2: 24 — AB (ref 13–22)
Chloride: 103 (ref 99–108)
Creatinine: 1 (ref 0.6–1.3)
Glucose: 116
Potassium: 4.2 (ref 3.4–5.3)
Sodium: 138 (ref 137–147)

## 2021-01-07 LAB — CBC: RBC: 5.07 (ref 3.87–5.11)

## 2021-01-07 LAB — CBC AND DIFFERENTIAL
HCT: 48 (ref 41–53)
Hemoglobin: 16.8 (ref 13.5–17.5)
Neutrophils Absolute: 10.7
WBC: 15.5

## 2021-01-07 LAB — COMPREHENSIVE METABOLIC PANEL: Calcium: 9.3 (ref 8.7–10.7)

## 2021-01-11 ENCOUNTER — Encounter: Payer: Self-pay | Admitting: Family Medicine

## 2021-01-11 ENCOUNTER — Ambulatory Visit (INDEPENDENT_AMBULATORY_CARE_PROVIDER_SITE_OTHER): Payer: BC Managed Care – PPO | Admitting: Family Medicine

## 2021-01-11 ENCOUNTER — Other Ambulatory Visit: Payer: Self-pay

## 2021-01-11 ENCOUNTER — Telehealth: Payer: Self-pay | Admitting: Radiology

## 2021-01-11 VITALS — BP 120/80 | HR 70 | Temp 98.3°F | Ht 75.0 in | Wt 260.0 lb

## 2021-01-11 DIAGNOSIS — D72829 Elevated white blood cell count, unspecified: Secondary | ICD-10-CM

## 2021-01-11 DIAGNOSIS — I10 Essential (primary) hypertension: Secondary | ICD-10-CM

## 2021-01-11 DIAGNOSIS — K0510 Chronic gingivitis, plaque induced: Secondary | ICD-10-CM

## 2021-01-11 DIAGNOSIS — K068 Other specified disorders of gingiva and edentulous alveolar ridge: Secondary | ICD-10-CM

## 2021-01-11 DIAGNOSIS — E782 Mixed hyperlipidemia: Secondary | ICD-10-CM

## 2021-01-11 DIAGNOSIS — D582 Other hemoglobinopathies: Secondary | ICD-10-CM

## 2021-01-11 DIAGNOSIS — D75839 Thrombocytosis, unspecified: Secondary | ICD-10-CM

## 2021-01-11 LAB — CBC
HCT: 52.3 % — ABNORMAL HIGH (ref 39.0–52.0)
Hemoglobin: 18 g/dL (ref 13.0–17.0)
MCHC: 34.4 g/dL (ref 30.0–36.0)
MCV: 95.2 fl (ref 78.0–100.0)
Platelets: 450 10*3/uL — ABNORMAL HIGH (ref 150.0–400.0)
RBC: 5.49 Mil/uL (ref 4.22–5.81)
RDW: 13.9 % (ref 11.5–15.5)
WBC: 11.7 10*3/uL — ABNORMAL HIGH (ref 4.0–10.5)

## 2021-01-11 LAB — LDL CHOLESTEROL, DIRECT: Direct LDL: 122 mg/dL

## 2021-01-11 LAB — LIPID PANEL
Cholesterol: 184 mg/dL (ref 0–200)
HDL: 28.9 mg/dL — ABNORMAL LOW (ref >39.00)
NonHDL: 154.97
Total CHOL/HDL Ratio: 6
Triglycerides: 255 mg/dL — ABNORMAL HIGH (ref 0.0–149.0)
VLDL: 51 mg/dL — ABNORMAL HIGH (ref 0.0–40.0)

## 2021-01-11 NOTE — Patient Instructions (Signed)
Call if symptoms return for ENT referral or call dentist

## 2021-01-11 NOTE — Telephone Encounter (Signed)
Noted will discuss with patient

## 2021-01-11 NOTE — Assessment & Plan Note (Signed)
Has been working on [employer]. Recheck lipids.

## 2021-01-11 NOTE — Progress Notes (Signed)
Subjective:     Frank Stevens is a 45 y.o. 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 and was chewing gum and had a strange taste - was bleeding "perfusely"  - called GF who works for [employer] - went to [facility] and got mouthwash and gauze - bled for 6 hours saturating gauze - started getting dizzy and stomach upset - went to the hospital  - 2 hours into his hospital stay it stopped - was getting large clots from his gums - earlier in the week was having some bleeding with touching  - scheduled the dentist appt due to concern for abscess - gums were "on fire" on the left side - but heavy bleed on the left - dentist said no dental concern     Review of Systems  01/07/2021: ER - bleeding gums - abx treatment. dentist f/u  Social History   Tobacco Use  Smoking Status Current Every Day Smoker  . Packs/day: 0.75  . Years: 30.00  . Pack years: 22.50  . Types: Cigarettes  Smokeless Tobacco Former Systems developer  . Types: Chew  Tobacco Comment   2 ppd x 30 years        Objective:    BP Readings from Last 3 Encounters:  01/11/21 120/80  12/01/20 130/80  09/06/20 130/80   Wt Readings from Last 3 Encounters:  01/11/21 260 lb (117.9 kg)  12/01/20 258 lb (117 kg)  09/06/20 258 lb 4 oz (117.1 kg)    BP 120/80   Pulse 70   Temp 98.3 F (36.8 C) (Temporal)   Ht 6\' 3"  (1.905 m)   Wt 260 lb (117.9 kg)   SpO2 97%   BMI 32.50 kg/m    Physical Exam Constitutional:      Appearance: Normal appearance. He is not ill-appearing or diaphoretic.  HENT:     Right Ear: External ear normal.     Left Ear: External ear normal.     Mouth/Throat:     Mouth: Mucous membranes are moist.     Pharynx: No oropharyngeal exudate or posterior oropharyngeal erythema.     Comments: Gums without swelling, erythema or lesions Eyes:     General: No scleral icterus.    Extraocular Movements: Extraocular movements  intact.     Conjunctiva/sclera: Conjunctivae normal.  Cardiovascular:     Rate and Rhythm: Normal rate.  Pulmonary:     Effort: Pulmonary effort is normal.  Musculoskeletal:     Cervical back: Neck supple.  Skin:    General: Skin is warm and dry.  Neurological:     Mental Status: He is alert. Mental status is at baseline.  Psychiatric:        Mood and Affect: Mood normal.           Assessment & Plan:   Problem List Items Addressed This Visit      Cardiovascular and Mediastinum   Essential hypertension    BP well controlled on diet and quitting smoking.         Other   Hyperlipidemia    Has been working on [employer]. Recheck lipids.       Relevant Orders   Lipid panel    Other Visit Diagnoses    Leukocytosis, unspecified type    -  Primary   Relevant Orders   CBC   Gingivitis  Suspect gums improved in setting of abx. Discussed if he symptoms return could plan ENT or dental evaluation. Otherwise watch and wait.   Repeat cbc due to leukocytosis.   Return if symptoms worsen or fail to improve.  Lesleigh Noe, MD  This visit occurred during the SARS-CoV-2 public health emergency.  Safety protocols were in place, including screening questions prior to the visit, additional usage of staff PPE, and extensive cleaning of exam room while observing appropriate contact time as indicated for disinfecting solutions.

## 2021-01-11 NOTE — Assessment & Plan Note (Signed)
BP well controlled on diet and quitting smoking.

## 2021-01-11 NOTE — Telephone Encounter (Signed)
[facility] called a critical HGB - 18.0, results sent to Dr Einar Pheasant and verbally given to Dr Danise Mina

## 2021-01-12 NOTE — Telephone Encounter (Signed)
Called pt will refer to hematology  Pt with elevated WBC, Hgb/HCT, and platelets and with recent bleeding event. Did place urgent for quick review, but will defer to hematology given no acute symptoms

## 2021-01-12 NOTE — Addendum Note (Signed)
Addended by: Lesleigh Noe on: 01/12/2021 01:28 PM   Modules accepted: Orders

## 2021-01-13 ENCOUNTER — Telehealth: Payer: Self-pay | Admitting: Hematology and Oncology

## 2021-01-13 NOTE — Telephone Encounter (Signed)
Received a new hem referral from Dr. Einar Pheasant for thrombocytosis and leukocytosis. Pt has been cld and scheduled to see Dr. Alvy Bimler on 3/25 at 1pm. Pt aware to arrive 30 minutes early.

## 2021-01-20 ENCOUNTER — Other Ambulatory Visit: Payer: Self-pay | Admitting: Hematology and Oncology

## 2021-01-20 ENCOUNTER — Other Ambulatory Visit: Payer: Self-pay

## 2021-01-20 ENCOUNTER — Inpatient Hospital Stay: Payer: BC Managed Care – PPO

## 2021-01-20 ENCOUNTER — Encounter: Payer: Self-pay | Admitting: Hematology and Oncology

## 2021-01-20 ENCOUNTER — Inpatient Hospital Stay: Payer: BC Managed Care – PPO | Attending: Hematology and Oncology | Admitting: Hematology and Oncology

## 2021-01-20 VITALS — BP 126/85 | HR 64 | Resp 18

## 2021-01-20 DIAGNOSIS — Z800 Family history of malignant neoplasm of digestive organs: Secondary | ICD-10-CM

## 2021-01-20 DIAGNOSIS — Z79899 Other long term (current) drug therapy: Secondary | ICD-10-CM | POA: Diagnosis not present

## 2021-01-20 DIAGNOSIS — D75839 Thrombocytosis, unspecified: Secondary | ICD-10-CM | POA: Diagnosis not present

## 2021-01-20 DIAGNOSIS — D751 Secondary polycythemia: Secondary | ICD-10-CM

## 2021-01-20 DIAGNOSIS — F17210 Nicotine dependence, cigarettes, uncomplicated: Secondary | ICD-10-CM

## 2021-01-20 DIAGNOSIS — Z8379 Family history of other diseases of the digestive system: Secondary | ICD-10-CM

## 2021-01-20 DIAGNOSIS — Z833 Family history of diabetes mellitus: Secondary | ICD-10-CM | POA: Diagnosis not present

## 2021-01-20 DIAGNOSIS — K0510 Chronic gingivitis, plaque induced: Secondary | ICD-10-CM | POA: Diagnosis not present

## 2021-01-20 DIAGNOSIS — D72829 Elevated white blood cell count, unspecified: Secondary | ICD-10-CM | POA: Diagnosis not present

## 2021-01-20 DIAGNOSIS — Z720 Tobacco use: Secondary | ICD-10-CM

## 2021-01-20 DIAGNOSIS — Z8249 Family history of ischemic heart disease and other diseases of the circulatory system: Secondary | ICD-10-CM | POA: Diagnosis not present

## 2021-01-20 DIAGNOSIS — Z8719 Personal history of other diseases of the digestive system: Secondary | ICD-10-CM

## 2021-01-20 DIAGNOSIS — D72825 Bandemia: Secondary | ICD-10-CM

## 2021-01-20 NOTE — Assessment & Plan Note (Signed)
The cause of the polycythemia is likely due to profound dehydration, heavy smoking and possibly undiagnosed sleep apnea I recommend urgent phlebotomy today I recommend the patient to drink at least half a gallon of water daily I will order additional work-up and tests in his next visit

## 2021-01-20 NOTE — Progress Notes (Signed)
[facility] NOTE  Patient Care Team: Lesleigh Noe, MD as PCP - General (Family Medicine) Kate Sable, MD as PCP - Cardiology (Cardiology)  CHIEF COMPLAINTS/PURPOSE OF CONSULTATION:  Abnormal CBC He is here accompanied by his girlfriend, Carla  HISTORY OF PRESENTING ILLNESS:  Frank Stevens 45 y.o. male is here because of abnormal CBC.  He was found to have abnormal CBC from recent blood draw I have the opportunity to review his blood count dated back to 2017 On Mar 06, 2016, white count 9.2, hemoglobin 16.5 and platelet count 352 On February 22, 2017, white blood cell count 9.4, hemoglobin 17.3 and platelet count 361 On November 02, 2019, white blood cell count 15.8, hemoglobin 17.4 and platelet count 390 On September 06, 2020, white blood cell count 11.1, hemoglobin 17.3 and platelet count 419 Most recently, on January 12, 2020, white blood cell count 11.7, hemoglobin 18 and platelet count 450 Recently, he went to the emergency room with presentation for gingivitis and was prescribed antibiotics  There is not reported symptoms of sinus congestion, urinary frequency/urgency or dysuria, diarrhea, joint swelling/pain or abnormal skin rash.  He has chronic cough secondary to smoking He had no prior history or diagnosis of cancer. His age appropriate screening programs are up-to-date. The patient has no prior diagnosis of autoimmune disease and was not prescribed corticosteroids related products. The patient is a smoker and currently smokes 2 pack of cigarettes per day for the last 31 years. He has intermittent headaches He snores but has not been formally evaluated and rule out for obstructive sleep apnea He has never donated blood.  He said he had history of hepatitis as a child He admits he is not drinking enough water.  On a regular day, he only drinks 30 ounces of water  MEDICAL HISTORY:  Past Medical History:  Diagnosis Date  . Asthma    childhood  . GERD  (gastroesophageal reflux disease)   . Hepatitis    age 39  . Pre-diabetes   . Pulmonary nodule 12/17/2018   needs follow up CT in 1 year    SURGICAL HISTORY: Past Surgical History:  Procedure Laterality Date  . GANGLION CYST EXCISION Left   . lymph node     lymph node removed under left arm due to cat scratch fever. as a kid    SOCIAL HISTORY: Social History   Socioeconomic History  . Marital status: Single    Spouse name: Not on file  . Number of children: 1  . Years of education: GED, associate degree  . Highest education level: Not on file  Occupational History  . Occupation: works as an Print production planner  . Smoking status: Current Every Day Smoker    Packs/day: 2.00    Years: 31.00    Pack years: 62.00    Types: Cigarettes  . Smokeless tobacco: Former Systems developer    Types: Chew  . Tobacco comment: 2 ppd x 30 years  Vaping Use  . Vaping Use: Former  . Start date: 02/27/2015  . Quit date: 10/30/2015  Substance and Sexual Activity  . Alcohol use: Yes    Comment: 1-2 times per year  . Drug use: Not Currently  . Sexual activity: Yes    Birth control/protection: Pill  Other Topics Concern  . Not on file  Social History Narrative   Girlfriend - Elmo Putt - lives in [place] - [place] - 22 - just finished college   Enjoys -  doesn't have a lot time for freetime   Works as a Freight forwarder at [employer]   Exercise: works, not regularly - time is a limiting factor   Diet: not great   Social Determinants of Sales executive: Not on file  Food Insecurity: Not on file  Transportation Needs: Not on file  Physical Activity: Not on file  Stress: Not on file  Social Connections: Not on file  Intimate Partner Violence: Not on file    FAMILY HISTORY: Family History  Problem Relation Age of Onset  . Arthritis Mother   . COPD Mother   . Diabetes Mother   . Heart disease Mother   . Colon cancer Mother 59  . Congestive Heart Failure  Mother   . Alcohol abuse Father   . Drug abuse Father   . Early death Father   . Kidney disease Father   . Cirrhosis Father   . Asthma Sister     ALLERGIES:  is allergic to penicillins.  MEDICATIONS:  Current Outpatient Medications  Medication Sig Dispense Refill  . albuterol (VENTOLIN HFA) 108 (90 Base) MCG/ACT inhaler TAKE 2 PUFFS BY MOUTH EVERY 6 HOURS AS NEEDED FOR WHEEZE OR SHORTNESS OF BREATH 8.5 each 1  . clindamycin (CLEOCIN) 150 MG capsule Take 450 mg by mouth 3 (three) times daily.    . metroNIDAZOLE (FLAGYL) 500 MG tablet Take 500 mg by mouth 3 (three) times daily.    . sildenafil (REVATIO) 20 MG tablet Take 3 tablets PO PRN 10 tablet 1  . valACYclovir (VALTREX) 500 MG tablet TAKE 1 TABLET BY MOUTH EVERY DAY 30 tablet 11   No current facility-administered medications for this visit.    REVIEW OF SYSTEMS:   Constitutional: Denies fevers, chills or abnormal night sweats Eyes: Denies blurriness of vision, double vision or watery eyes Ears, nose, mouth, throat, and face: Denies mucositis or sore throat Cardiovascular: Denies palpitation, chest discomfort or lower extremity swelling Gastrointestinal:  Denies nausea, heartburn or change in bowel habits Skin: Denies abnormal skin rashes Lymphatics: Denies new lymphadenopathy or easy bruising Neurological:Denies numbness, tingling or new weaknesses Behavioral/Psych: Mood is stable, no new changes  All other systems were reviewed with the patient and are negative.  PHYSICAL EXAMINATION: ECOG PERFORMANCE STATUS: 1 - Symptomatic but completely ambulatory  Vitals:   01/20/21 1306  BP: (!) 143/85  Pulse: 80  Resp: 18  Temp: (!) 97.4 F (36.3 C)  SpO2: 100%   Filed Weights   01/20/21 1306  Weight: 265 lb 6.4 oz (120.4 kg)    GENERAL:alert, no distress and comfortable.  He looks plethoric SKIN: skin color, texture, turgor are normal, no rashes or significant lesions EYES: normal, conjunctiva are pink and  non-injected, sclera clear OROPHARYNX:no exudate, no erythema and lips, buccal mucosa, and tongue normal.  Noted gingivitis NECK: supple, thyroid normal size, non-tender, without nodularity LYMPH:  no palpable lymphadenopathy in the cervical, axillary or inguinal LUNGS: clear to auscultation and percussion with normal breathing effort HEART: regular rate & rhythm and no murmurs and no lower extremity edema ABDOMEN:abdomen soft, non-tender and normal bowel sounds, no splenomegaly Musculoskeletal:no cyanosis of digits and no clubbing  PSYCH: alert & oriented x 3 with fluent speech NEURO: no focal motor/sensory deficits  LABORATORY DATA:  I have reviewed the data as listed Recent Results (from the past 2160 hour(s))  CBC and differential     Status: None   Collection Time: 01/07/21 12:00 AM  Result Value  Ref Range   Hemoglobin 16.8 13.5 - 17.5   HCT 48 41 - 53   Neutrophils Absolute 10.70    WBC 15.5   CBC     Status: None   Collection Time: 01/07/21 12:00 AM  Result Value Ref Range   RBC 5.07 3.87 - 1.88  Basic metabolic panel     Status: Abnormal   Collection Time: 01/07/21 12:00 AM  Result Value Ref Range   Glucose 116    BUN 15 4 - 21   CO2 24 (A) 13 - 22   Creatinine 1.0 0.6 - 1.3   Potassium 4.2 3.4 - 5.3   Sodium 138 137 - 147   Chloride 103 99 - 108  Comprehensive metabolic panel     Status: None   Collection Time: 01/07/21 12:00 AM  Result Value Ref Range   Calcium 9.3 8.7 - 10.7  CBC     Status: Abnormal   Collection Time: 01/11/21  9:42 AM  Result Value Ref Range   WBC 11.7 (H) 4.0 - 10.5 K/uL   RBC 5.49 4.22 - 5.81 Mil/uL   Platelets 450.0 (H) 150.0 - 400.0 K/uL   Hemoglobin 18.0 Repeated and verified X2. (HH) 13.0 - 17.0 g/dL   HCT 52.3 (H) 39.0 - 52.0 %   MCV 95.2 78.0 - 100.0 fl   MCHC 34.4 30.0 - 36.0 g/dL   RDW 13.9 11.5 - 15.5 %  Lipid panel     Status: Abnormal   Collection Time: 01/11/21  9:42 AM  Result Value Ref Range   Cholesterol 184 0 - 200  mg/dL    Comment: ATP III Classification       Desirable:  < 200 mg/dL               Borderline High:  200 - 239 mg/dL          High:  > = 240 mg/dL   Triglycerides 255.0 (H) 0.0 - 149.0 mg/dL    Comment: Normal:  <150 mg/dLBorderline High:  150 - 199 mg/dL   HDL 28.90 (L) >39.00 mg/dL   VLDL 51.0 (H) 0.0 - 40.0 mg/dL   Total CHOL/HDL Ratio 6     Comment:                Men          Women1/2 Average Risk     3.4          3.3Average Risk          5.0          4.42X Average Risk          9.6          7.13X Average Risk          15.0          11.0                       NonHDL 154.97     Comment: NOTE:  Non-HDL goal should be 30 mg/dL higher than patient's LDL goal (i.e. LDL goal of < 70 mg/dL, would have non-HDL goal of < 100 mg/dL)  LDL cholesterol, direct     Status: None   Collection Time: 01/11/21  9:42 AM  Result Value Ref Range   Direct LDL 122.0 mg/dL    Comment: Optimal:  <100 mg/dLNear or Above Optimal:  100-129 mg/dLBorderline High:  130-159 mg/dLHigh:  160-189 mg/dLVery High:  >190 mg/dL  RADIOGRAPHIC STUDIES: I reviewed his prior CT imaging which show no evidence of splenomegaly I have personally reviewed the radiological images as listed and agreed with the findings in the report.  ASSESSMENT & PLAN  Polycythemia The cause of the polycythemia is likely due to profound dehydration, heavy smoking and possibly undiagnosed sleep apnea I recommend urgent phlebotomy today I recommend the patient to drink at least half a gallon of water daily I will order additional work-up and tests in his next visit  Leukocytosis His white blood cell count fluctuate up and down He is on antibiotic treatment This is likely due to recent gum infection Observe only Smoking can also cause leukocytosis due to COPD/reactive airway  Thrombocytosis Likely reactive in nature Observe closely for now I recommend 81 mg aspirin  Tobacco use We have significant discussion about the importance of  smoking cessation He appears highly motivated to quit He will work with his partner for now I set a goal for him to go down to at least 1 pack of cigarettes or less by next visit and he is in agreement   Orders Placed This Encounter  Procedures  . CBC with Differential/Platelet    Standing Status:   Standing    Number of Occurrences:   22    Standing Expiration Date:   01/20/2022  . Erythropoietin    Standing Status:   Future    Standing Expiration Date:   01/20/2022  . Sedimentation rate    Standing Status:   Future    Standing Expiration Date:   01/20/2022  . Lactate dehydrogenase    Standing Status:   Future    Standing Expiration Date:   01/20/2022  . Hepatitis B surface antibody,qualitative    Standing Status:   Future    Standing Expiration Date:   01/20/2022  . Hepatitis B surface antigen    Standing Status:   Future    Standing Expiration Date:   01/20/2022  . Hepatitis B core antibody, IgM    Standing Status:   Future    Standing Expiration Date:   01/20/2022    All questions were answered. The patient knows to call the clinic with any problems, questions or concerns. I spent 55 minutes counseling the patient face to face.     Heath Lark, MD 01/20/2021 1:56 PM

## 2021-01-20 NOTE — Assessment & Plan Note (Signed)
Likely reactive in nature Observe closely for now I recommend 81 mg aspirin

## 2021-01-20 NOTE — Assessment & Plan Note (Signed)
We have significant discussion about the importance of smoking cessation He appears highly motivated to quit He will work with his partner for now I set a goal for him to go down to at least 1 pack of cigarettes or less by next visit and he is in agreement

## 2021-01-20 NOTE — Progress Notes (Signed)
Pt tolerated phlebotomy of 500 gm over 15 minutes.

## 2021-01-20 NOTE — Patient Instructions (Signed)

## 2021-01-20 NOTE — Assessment & Plan Note (Signed)
His white blood cell count fluctuate up and down He is on antibiotic treatment This is likely due to recent gum infection Observe only Smoking can also cause leukocytosis due to COPD/reactive airway

## 2021-02-14 ENCOUNTER — Ambulatory Visit (INDEPENDENT_AMBULATORY_CARE_PROVIDER_SITE_OTHER): Payer: BLUE CROSS/BLUE SHIELD | Admitting: Family

## 2021-02-14 ENCOUNTER — Encounter: Payer: Self-pay | Admitting: Family

## 2021-02-14 ENCOUNTER — Other Ambulatory Visit: Payer: Self-pay

## 2021-02-14 VITALS — BP 125/69 | HR 73 | Temp 98.5°F | Ht 75.0 in | Wt 263.6 lb

## 2021-02-14 DIAGNOSIS — N3001 Acute cystitis with hematuria: Secondary | ICD-10-CM | POA: Diagnosis not present

## 2021-02-14 DIAGNOSIS — R319 Hematuria, unspecified: Secondary | ICD-10-CM

## 2021-02-14 DIAGNOSIS — R350 Frequency of micturition: Secondary | ICD-10-CM | POA: Diagnosis not present

## 2021-02-14 DIAGNOSIS — D751 Secondary polycythemia: Secondary | ICD-10-CM

## 2021-02-14 LAB — POCT URINALYSIS DIPSTICK
Bilirubin, UA: NEGATIVE
Glucose, UA: NEGATIVE
Ketones, UA: POSITIVE
Nitrite, UA: POSITIVE
Protein, UA: POSITIVE — AB
Spec Grav, UA: >=1.03 — AB (ref 1.010–1.025)
Urobilinogen, UA: 1 E.U./dL
pH, UA: 5.5 (ref 5.0–8.0)

## 2021-02-14 MED ORDER — CIPROFLOXACIN HCL 500 MG PO TABS: 500.0000 mg | ORAL_TABLET | Freq: Two times a day (BID) | ORAL | 0 refills | Status: DC

## 2021-02-14 NOTE — Patient Instructions (Signed)
Urinary Tract Infection, Adult A urinary tract infection (UTI) is an infection of any part of the urinary tract. The urinary tract includes:  The kidneys.  The ureters.  The bladder.  The urethra. These organs make, store, and get rid of pee (urine) in the body. What are the causes? This infection is caused by germs (bacteria) in your genital area. These germs grow and cause swelling (inflammation) of your urinary tract. What increases the risk? The following factors may make you more likely to develop this condition:  Using a small, thin tube (catheter) to drain pee.  Not being able to control when you pee or poop (incontinence).  Being male. If you are male, these things can increase the risk: ? Using these methods to prevent pregnancy:  A medicine that kills sperm (spermicide).  A device that blocks sperm (diaphragm). ? Having low levels of a male hormone (estrogen). ? Being pregnant. You are more likely to develop this condition if:  You have genes that add to your risk.  You are sexually active.  You take antibiotic medicines.  You have trouble peeing because of: ? A prostate that is bigger than normal, if you are male. ? A blockage in the part of your body that drains pee from the bladder. ? A kidney stone. ? A nerve condition that affects your bladder. ? Not getting enough to drink. ? Not peeing often enough.  You have other conditions, such as: ? Diabetes. ? A weak disease-fighting system (immune system). ? Sickle cell disease. ? Gout. ? Injury of the spine. What are the signs or symptoms? Symptoms of this condition include:  Needing to pee right away.  Peeing small amounts often.  Pain or burning when peeing.  Blood in the pee.  Pee that smells bad or not like normal.  Trouble peeing.  Pee that is cloudy.  Fluid coming from the vagina, if you are male.  Pain in the belly or lower back. Other symptoms include:  Vomiting.  Not  feeling hungry.  Feeling mixed up (confused). This may be the first symptom in older adults.  Being tired and grouchy (irritable).  A fever.  Watery poop (diarrhea). How is this treated?  Taking antibiotic medicine.  Taking other medicines.  Drinking enough water. In some cases, you may need to see a specialist. Follow these instructions at home: Medicines  Take over-the-counter and prescription medicines only as told by your doctor.  If you were prescribed an antibiotic medicine, take it as told by your doctor. Do not stop taking it even if you start to feel better. General instructions  Make sure you: ? Pee until your bladder is empty. ? Do not hold pee for a long time. ? Empty your bladder after sex. ? Wipe from front to back after peeing or pooping if you are a male. Use each tissue one time when you wipe.  Drink enough fluid to keep your pee pale yellow.  Keep all follow-up visits.   Contact a doctor if:  You do not get better after 1-2 days.  Your symptoms go away and then come back. Get help right away if:  You have very bad back pain.  You have very bad pain in your lower belly.  You have a fever.  You have chills.  You feeling like you will vomit or you vomit. Summary  A urinary tract infection (UTI) is an infection of any part of the urinary tract.  This condition is caused by   germs in your genital area.  There are many risk factors for a UTI.  Treatment includes antibiotic medicines.  Drink enough fluid to keep your pee pale yellow. This information is not intended to replace advice given to you by your health care provider. Make sure you discuss any questions you have with your health care provider. Document Revised: 05/27/2020 Document Reviewed: 05/27/2020 Elsevier Patient Education  2021 [employer]  

## 2021-02-14 NOTE — Progress Notes (Signed)
Acute Office Visit  Subjective:    Patient ID: Frank Stevens, male    DOB: [birth year], 45 y.o.   MRN: [id number]  Chief Complaint  Patient presents with  . Hematuria    Pt says it started yesterday. And was black in color. Mild black pain, and a mild sensation in his urethra. He describes it as feelings of movement.     HPI Patient is in today with complaints of low back pain burning with urination, frequency, and urgency to urinate x2 days.  Patient admits to having anal sex with his male partner on Saturday.  Denies any fever or chills.  Has a history of thrombocytosis.  Is under the care of hematology.  Past Medical History:  Diagnosis Date  . Asthma    childhood  . GERD (gastroesophageal reflux disease)   . Hepatitis    age 1  . Pre-diabetes   . Pulmonary nodule 12/17/2018   needs follow up CT in 1 year    Past Surgical History:  Procedure Laterality Date  . GANGLION CYST EXCISION Left   . lymph node     lymph node removed under left arm due to cat scratch fever. as a kid    Family History  Problem Relation Age of Onset  . Arthritis Mother   . COPD Mother   . Diabetes Mother   . Heart disease Mother   . Colon cancer Mother 61  . Congestive Heart Failure Mother   . Alcohol abuse Father   . Drug abuse Father   . Early death Father   . Kidney disease Father   . Cirrhosis Father   . Asthma Sister     Social History   Socioeconomic History  . Marital status: Single    Spouse name: Not on file  . Number of children: 1  . Years of education: GED, associate degree  . Highest education level: Not on file  Occupational History  . Occupation: works as an Print production planner  . Smoking status: Current Every Day Smoker    Packs/day: 2.00    Years: 31.00    Pack years: 62.00    Types: Cigarettes  . Smokeless tobacco: Former Systems developer    Types: Chew  . Tobacco comment: 2 ppd x 30 years  Vaping Use  . Vaping Use: Former  . Start date: 02/27/2015  . Quit date:  10/30/2015  Substance and Sexual Activity  . Alcohol use: Yes    Comment: 1-2 times per year  . Drug use: Not Currently  . Sexual activity: Yes    Birth control/protection: Pill  Other Topics Concern  . Not on file  Social History Narrative   Girlfriend - Elmo Putt - lives in [place] - 22 - just finished college   Enjoys - doesn't have a lot time for freetime   Works as a Freight forwarder at [employer]   Exercise: works, not regularly - time is a limiting factor   Diet: not great   Social Determinants of Radio broadcast assistant Strain: Not on [employer] Insecurity: Not on file  Transportation Needs: Not on file  Physical Activity: Not on file  Stress: Not on file  Social Connections: Not on file  Intimate Partner Violence: Not on file    Outpatient Medications Prior to Visit  Medication Sig Dispense Refill  . albuterol (VENTOLIN HFA) 108 (90 Base) MCG/ACT inhaler TAKE 2 PUFFS BY MOUTH EVERY 6  HOURS AS NEEDED FOR WHEEZE OR SHORTNESS OF BREATH 8.5 each 1  . sildenafil (REVATIO) 20 MG tablet Take 3 tablets PO PRN 10 tablet 1  . valACYclovir (VALTREX) 500 MG tablet TAKE 1 TABLET BY MOUTH EVERY DAY 30 tablet 11  . clindamycin (CLEOCIN) 150 MG capsule Take 450 mg by mouth 3 (three) times daily.    . metroNIDAZOLE (FLAGYL) 500 MG tablet Take 500 mg by mouth 3 (three) times daily.     No facility-administered medications prior to visit.    Allergies  Allergen Reactions  . Penicillins     Vomiting     Review of Systems  Constitutional: Negative.   Respiratory: Negative.   Cardiovascular: Negative.   Gastrointestinal: Negative.  Negative for abdominal pain.  Genitourinary: Positive for dysuria, frequency, hematuria and urgency.  Musculoskeletal: Negative.   Psychiatric/Behavioral: Negative.        Objective:    Physical Exam Vitals reviewed.  Constitutional:      Appearance: Normal appearance.  Cardiovascular:     Rate and Rhythm:  Normal rate and regular rhythm.  Pulmonary:     Effort: Pulmonary effort is normal.     Breath sounds: Normal breath sounds.  Abdominal:     General: Abdomen is flat.     Palpations: Abdomen is soft.  Musculoskeletal:        General: Normal range of motion.     Cervical back: Normal range of motion and neck supple.  Skin:    General: Skin is warm and dry.  Neurological:     General: No focal deficit present.     Mental Status: He is alert and oriented to person, place, and time.  Psychiatric:        Mood and Affect: Mood normal.        Behavior: Behavior normal.     BP 125/69   Pulse 73   Temp 98.5 F (36.9 C) (Temporal)   Ht 6\' 3"  (1.905 m)   Wt 263 lb 9.6 oz (119.6 kg)   SpO2 97%   BMI 32.95 kg/m  Wt Readings from Last 3 Encounters:  02/14/21 263 lb 9.6 oz (119.6 kg)  01/20/21 265 lb 6.4 oz (120.4 kg)  01/11/21 260 lb (117.9 kg)    Health Maintenance Due  Topic Date Due  . COVID-19 Vaccine (1) Never done    There are no preventive care reminders to display for this patient.   Lab Results  Component Value Date   TSH 0.72 02/22/2017   Lab Results  Component Value Date   WBC 11.7 (H) 01/11/2021   HGB 18.0 Repeated and verified X2. (HH) 01/11/2021   HCT 52.3 (H) 01/11/2021   MCV 95.2 01/11/2021   PLT 450.0 (H) 01/11/2021   Lab Results  Component Value Date   NA 138 01/07/2021   K 4.2 01/07/2021   CO2 24 (A) 01/07/2021   GLUCOSE 102 (H) 09/06/2020   BUN 15 01/07/2021   CREATININE 1.0 01/07/2021   BILITOT 0.3 09/06/2020   ALKPHOS 69 09/06/2020   AST 17 09/06/2020   ALT 21 09/06/2020   PROT 7.3 09/06/2020   ALBUMIN 4.4 09/06/2020   CALCIUM 9.3 01/07/2021   ANIONGAP 10 11/02/2019   GFR 89.13 09/06/2020   Lab Results  Component Value Date   CHOL 184 01/11/2021   Lab Results  Component Value Date   HDL 28.90 (L) 01/11/2021   Lab Results  Component Value Date   LDLCALC 103 02/22/2017   Lab Results  Component Value Date   TRIG 255.0 (H)  01/11/2021   Lab Results  Component Value Date   CHOLHDL 6 01/11/2021   Lab Results  Component Value Date   HGBA1C 6.0 09/06/2020       Assessment & Plan:   Problem List Items Addressed This Visit    Polycythemia    Other Visit Diagnoses    Hematuria, unspecified type    -  Primary   Relevant Orders   POCT Urinalysis Dipstick (Completed)   Urine Culture   Urinary frequency       Acute cystitis with hematuria           Meds ordered this encounter  Medications  . DISCONTD: ciprofloxacin (CIPRO) 500 MG tablet    Sig: Take 1 tablet (500 mg total) by mouth 2 (two) times daily.    Dispense:  20 tablet    Refill:  0  . ciprofloxacin (CIPRO) 500 MG tablet    Sig: Take 1 tablet (500 mg total) by mouth 2 (two) times daily.    Dispense:  20 tablet    Refill:  0   Urinary tract infection related to anal intercourse.  Likely bacterial source E. coli.  We will treat with Cipro twice a day.  Advised patient to be sure that he lets his hematologist know that he had blood in his urine so that it could be rechecked at his visit next week.  Void after intercourse.  Drink plenty of fluids.  Call if symptoms worsen or persist.  Kennyth Arnold, FNP

## 2021-02-15 LAB — URINE CULTURE
MICRO NUMBER:: 11787025
SPECIMEN QUALITY:: ADEQUATE

## 2021-02-16 ENCOUNTER — Ambulatory Visit: Payer: BLUE CROSS/BLUE SHIELD | Admitting: Family Medicine

## 2021-02-20 ENCOUNTER — Inpatient Hospital Stay: Payer: BC Managed Care – PPO

## 2021-02-20 ENCOUNTER — Other Ambulatory Visit: Payer: Self-pay

## 2021-02-20 ENCOUNTER — Inpatient Hospital Stay: Payer: BC Managed Care – PPO | Attending: Hematology and Oncology | Admitting: Hematology and Oncology

## 2021-02-20 DIAGNOSIS — D72825 Bandemia: Secondary | ICD-10-CM

## 2021-02-20 DIAGNOSIS — R053 Chronic cough: Secondary | ICD-10-CM | POA: Insufficient documentation

## 2021-02-20 DIAGNOSIS — Z720 Tobacco use: Secondary | ICD-10-CM | POA: Diagnosis not present

## 2021-02-20 DIAGNOSIS — F17210 Nicotine dependence, cigarettes, uncomplicated: Secondary | ICD-10-CM | POA: Insufficient documentation

## 2021-02-20 DIAGNOSIS — Z8719 Personal history of other diseases of the digestive system: Secondary | ICD-10-CM

## 2021-02-20 DIAGNOSIS — D751 Secondary polycythemia: Secondary | ICD-10-CM | POA: Insufficient documentation

## 2021-02-20 DIAGNOSIS — D75839 Thrombocytosis, unspecified: Secondary | ICD-10-CM | POA: Diagnosis not present

## 2021-02-20 DIAGNOSIS — D72829 Elevated white blood cell count, unspecified: Secondary | ICD-10-CM | POA: Diagnosis not present

## 2021-02-20 LAB — CBC WITH DIFFERENTIAL/PLATELET
Abs Immature Granulocytes: 0.05 10*3/uL (ref 0.00–0.07)
Basophils Absolute: 0.1 10*3/uL (ref 0.0–0.1)
Basophils Relative: 1 %
Eosinophils Absolute: 0.3 10*3/uL (ref 0.0–0.5)
Eosinophils Relative: 2 %
HCT: 47 % (ref 39.0–52.0)
Hemoglobin: 16.5 g/dL (ref 13.0–17.0)
Immature Granulocytes: 1 %
Lymphocytes Relative: 32 %
Lymphs Abs: 3.6 10*3/uL (ref 0.7–4.0)
MCH: 33.1 pg (ref 26.0–34.0)
MCHC: 35.1 g/dL (ref 30.0–36.0)
MCV: 94.2 fL (ref 80.0–100.0)
Monocytes Absolute: 0.9 10*3/uL (ref 0.1–1.0)
Monocytes Relative: 8 %
Neutro Abs: 6.2 10*3/uL (ref 1.7–7.7)
Neutrophils Relative %: 56 %
Platelets: 413 10*3/uL — ABNORMAL HIGH (ref 150–400)
RBC: 4.99 MIL/uL (ref 4.22–5.81)
RDW: 12.9 % (ref 11.5–15.5)
WBC: 11 10*3/uL — ABNORMAL HIGH (ref 4.0–10.5)
nRBC: 0 % (ref 0.0–0.2)

## 2021-02-20 LAB — HEPATITIS B SURFACE ANTIGEN: Hepatitis B Surface Ag: NONREACTIVE

## 2021-02-20 LAB — HEPATITIS B SURFACE ANTIBODY,QUALITATIVE: Hep B S Ab: REACTIVE — AB

## 2021-02-20 LAB — HEPATITIS B CORE ANTIBODY, IGM: Hep B C IgM: NONREACTIVE

## 2021-02-20 LAB — SEDIMENTATION RATE: Sed Rate: 0 mm/hr (ref 0–16)

## 2021-02-20 LAB — LACTATE DEHYDROGENASE: LDH: 205 U/L — ABNORMAL HIGH (ref 98–192)

## 2021-02-21 ENCOUNTER — Encounter: Payer: Self-pay | Admitting: Hematology and Oncology

## 2021-02-21 LAB — ERYTHROPOIETIN: Erythropoietin: 8.4 m[IU]/mL (ref 2.6–18.5)

## 2021-02-21 NOTE — Assessment & Plan Note (Signed)
The cause of his thrombocytosis is likely reactive in nature Monitor closely

## 2021-02-21 NOTE — Assessment & Plan Note (Signed)
We discussed the importance of nicotine cessation He will continue to try to quit by his next visit

## 2021-02-21 NOTE — Assessment & Plan Note (Signed)
The cause of the polycythemia is likely due to profound dehydration, heavy smoking and possibly undiagnosed sleep apnea After recent phlebotomy and aggressive attempt to quit smoking and drink more liquids, his hemoglobin has improved He does not need phlebotomy today I continue to encourage him to increase oral liquid intake as tolerated He will continue aspirin therapy I plan to see him again in 3 months for further follow-up

## 2021-02-21 NOTE — Assessment & Plan Note (Signed)
His leukocytosis has improved He was started on ciprofloxacin a week ago for infection This is the most likely cause of his leukocytosis It is benign in nature Does not need long-term follow-up

## 2021-02-21 NOTE — Progress Notes (Signed)
Frank Stevens OFFICE PROGRESS NOTE  Lesleigh Noe, MD  ASSESSMENT & PLAN:  Polycythemia The cause of the polycythemia is likely due to profound dehydration, heavy smoking and possibly undiagnosed sleep apnea After recent phlebotomy and aggressive attempt to quit smoking and drink more liquids, his hemoglobin has improved He does not need phlebotomy today I continue to encourage him to increase oral liquid intake as tolerated He will continue aspirin therapy I plan to see him again in 3 months for further follow-up  Leukocytosis His leukocytosis has improved He was started on ciprofloxacin a week ago for infection This is the most likely cause of his leukocytosis It is benign in nature Does not need long-term follow-up  Thrombocytosis The cause of his thrombocytosis is likely reactive in nature Monitor closely  Tobacco use We discussed the importance of nicotine cessation He will continue to try to quit by his next visit   No orders of the defined types were placed in this encounter.   The total time spent in the appointment was 20 minutes encounter with patients including review of chart and various tests results, discussions about plan of care and coordination of care plan   All questions were answered. The patient knows to call the clinic with any problems, questions or concerns. No barriers to learning was detected.    Frank Lark, MD 4/26/20228:29 AM  INTERVAL HISTORY: Frank Stevens 45 y.o. male returns for further follow-up He returns with his girlfriend Since last time I saw him, he was diagnosed with urinary tract infection and is currently receiving a course of antibiotics He is working hard and able to reduce his smoking recently He is also drinking more liquids  SUMMARY OF HEMATOLOGIC HISTORY: Frank Stevens 45 y.o. male is here because of abnormal CBC.  He was found to have abnormal CBC from recent blood draw I have the opportunity to review  his blood count dated back to 2017 On Mar 06, 2016, white count 9.2, hemoglobin 16.5 and platelet count 352 On February 22, 2017, white blood cell count 9.4, hemoglobin 17.3 and platelet count 361 On November 02, 2019, white blood cell count 15.8, hemoglobin 17.4 and platelet count 390 On September 06, 2020, white blood cell count 11.1, hemoglobin 17.3 and platelet count 419 Most recently, on January 12, 2020, white blood cell count 11.7, hemoglobin 18 and platelet count 450 Recently, he went to the emergency room with presentation for gingivitis and was prescribed antibiotics  There is not reported symptoms of sinus congestion, urinary frequency/urgency or dysuria, diarrhea, joint swelling/pain or abnormal skin rash.  He has chronic cough secondary to smoking He had no prior history or diagnosis of cancer. His age appropriate screening programs are up-to-date. The patient has no prior diagnosis of autoimmune disease and was not prescribed corticosteroids related products. The patient is a smoker and currently smokes 2 pack of cigarettes per day for the last 31 years. He has intermittent headaches He snores but has not been formally evaluated and rule out for obstructive sleep apnea He has never donated blood.  He said he had history of hepatitis as a child He admits he is not drinking enough water.  On a regular day, he only drinks 30 ounces of water On 01/20/2021, he had 1 unit of blood removed through phlebotomy  I have reviewed the past medical history, past surgical history, social history and family history with the patient and they are unchanged from previous note.  ALLERGIES:  is allergic  to penicillins.  MEDICATIONS:  Current Outpatient Medications  Medication Sig Dispense Refill  . aspirin EC 81 MG tablet Take 81 mg by mouth daily. Swallow whole.    . albuterol (VENTOLIN HFA) 108 (90 Base) MCG/ACT inhaler TAKE 2 PUFFS BY MOUTH EVERY 6 HOURS AS NEEDED FOR WHEEZE OR SHORTNESS OF BREATH 8.5  each 1  . ciprofloxacin (CIPRO) 500 MG tablet Take 1 tablet (500 mg total) by mouth 2 (two) times daily. 20 tablet 0  . sildenafil (REVATIO) 20 MG tablet Take 3 tablets PO PRN 10 tablet 1  . valACYclovir (VALTREX) 500 MG tablet TAKE 1 TABLET BY MOUTH EVERY DAY 30 tablet 11   No current facility-administered medications for this visit.     REVIEW OF SYSTEMS:   Constitutional: Denies fevers, chills or night sweats Eyes: Denies blurriness of vision Ears, nose, mouth, throat, and face: Denies mucositis or sore throat Respiratory: Denies cough, dyspnea or wheezes Cardiovascular: Denies palpitation, chest discomfort or lower extremity swelling Gastrointestinal:  Denies nausea, heartburn or change in bowel habits Skin: Denies abnormal skin rashes Lymphatics: Denies new lymphadenopathy or easy bruising Neurological:Denies numbness, tingling or new weaknesses Behavioral/Psych: Mood is stable, no new changes  All other systems were reviewed with the patient and are negative.  PHYSICAL EXAMINATION: ECOG PERFORMANCE STATUS: 0 - Asymptomatic  Vitals:   02/20/21 1521  BP: 129/84  Pulse: 77  Resp: 18  Temp: 97.9 F (36.6 C)  SpO2: 99%   Filed Weights   02/20/21 1521  Weight: 264 lb 3.2 oz (119.8 kg)    GENERAL:alert, no distress and comfortable NEURO: alert & oriented x 3 with fluent speech, no focal motor/sensory deficits  LABORATORY DATA:  I have reviewed the data as listed     Component Value Date/Time   NA 138 01/07/2021 0000   K 4.2 01/07/2021 0000   CL 103 01/07/2021 0000   CO2 24 (A) 01/07/2021 0000   GLUCOSE 102 (H) 09/06/2020 1125   BUN 15 01/07/2021 0000   CREATININE 1.0 01/07/2021 0000   CREATININE 1.01 09/06/2020 1125   CALCIUM 9.3 01/07/2021 0000   PROT 7.3 09/06/2020 1125   ALBUMIN 4.4 09/06/2020 1125   AST 17 09/06/2020 1125   ALT 21 09/06/2020 1125   ALKPHOS 69 09/06/2020 1125   BILITOT 0.3 09/06/2020 1125   GFRNONAA >60 11/02/2019 1239   GFRAA >60  11/02/2019 1239    No results found for: SPEP, UPEP  Lab Results  Component Value Date   WBC 11.0 (H) 02/20/2021   NEUTROABS 6.2 02/20/2021   HGB 16.5 02/20/2021   HCT 47.0 02/20/2021   MCV 94.2 02/20/2021   PLT 413 (H) 02/20/2021      Chemistry      Component Value Date/Time   NA 138 01/07/2021 0000   K 4.2 01/07/2021 0000   CL 103 01/07/2021 0000   CO2 24 (A) 01/07/2021 0000   BUN 15 01/07/2021 0000   CREATININE 1.0 01/07/2021 0000   CREATININE 1.01 09/06/2020 1125   GLU 116 01/07/2021 0000      Component Value Date/Time   CALCIUM 9.3 01/07/2021 0000   ALKPHOS 69 09/06/2020 1125   AST 17 09/06/2020 1125   ALT 21 09/06/2020 1125   BILITOT 0.3 09/06/2020 1125

## 2021-02-24 ENCOUNTER — Encounter: Payer: Self-pay | Admitting: Family Medicine

## 2021-02-28 ENCOUNTER — Other Ambulatory Visit: Payer: Self-pay

## 2021-02-28 ENCOUNTER — Ambulatory Visit (INDEPENDENT_AMBULATORY_CARE_PROVIDER_SITE_OTHER): Payer: BC Managed Care – PPO | Admitting: Family Medicine

## 2021-02-28 VITALS — BP 130/80 | HR 76 | Temp 97.5°F | Ht 75.0 in | Wt 260.5 lb

## 2021-02-28 DIAGNOSIS — R911 Solitary pulmonary nodule: Secondary | ICD-10-CM

## 2021-02-28 DIAGNOSIS — R310 Gross hematuria: Secondary | ICD-10-CM | POA: Insufficient documentation

## 2021-02-28 DIAGNOSIS — I10 Essential (primary) hypertension: Secondary | ICD-10-CM

## 2021-02-28 DIAGNOSIS — R319 Hematuria, unspecified: Secondary | ICD-10-CM

## 2021-02-28 LAB — POC URINALSYSI DIPSTICK (AUTOMATED)
Bilirubin, UA: NEGATIVE
Blood, UA: POSITIVE
Glucose, UA: NEGATIVE
Ketones, UA: NEGATIVE
Leukocytes, UA: NEGATIVE
Nitrite, UA: NEGATIVE
Protein, UA: POSITIVE — AB
Spec Grav, UA: >=1.03 — AB (ref 1.010–1.025)
Urobilinogen, UA: 0.2 E.U./dL
pH, UA: 5.5 (ref 5.0–8.0)

## 2021-02-28 NOTE — Assessment & Plan Note (Signed)
BP at goal. Cont healthy diet and attempts a smoking cessation.

## 2021-02-28 NOTE — Assessment & Plan Note (Signed)
Several episodes of gross hematuria last one on 4/29 while taking antibiotics for presumed UTI however, UCx was negative. Discussed risk factors male, 45 yo, smoker and would recommend urology referral for further evaluation. Will check BMP to evaluate kidney function as last checked 12/2020. CBC was stable 5 days after initial hematuria so no need to repeat at this time since gross hematuria has resolved.

## 2021-02-28 NOTE — Telephone Encounter (Signed)
Cindy at front desk brought note that pt had scheduled an appt on 03/01/21 at 11:20. I spoke with pt and yesterday there was a lot of blood in urine but today the urine is clear. Pt is not having any abd or back pain and pt does not know if hx of kidney stone. Pt is very concerned and appt was changed to 02/28/21 at 4:20 with UC and ED precautions. Sending note to DR Einar Pheasant as Juluis Rainier.

## 2021-02-28 NOTE — Patient Instructions (Addendum)
#   Hematuria - Refer to urology  - return for labs   Cancel appointment next week

## 2021-02-28 NOTE — Progress Notes (Signed)
Subjective:     Frank Stevens is a 45 y.o. male presenting for No chief complaint on file.     HPI  #blood in urine - saw someone on 02/14/2021 - started on abx and hematuria improved - no pain, no urgency, no frequency - endorses jaw pain - but planning to see the dentist for this - no blood in ejactulate   Review of Systems   Social History   Tobacco Use  Smoking Status Current Every Day Smoker  . Packs/day: 2.00  . Years: 31.00  . Pack years: 62.00  . Types: Cigarettes  Smokeless Tobacco Former Systems developer  . Types: Chew  Tobacco Comment   2 ppd x 30 years        Objective:    BP Readings from Last 3 Encounters:  02/28/21 130/80  02/20/21 129/84  02/14/21 125/69   Wt Readings from Last 3 Encounters:  02/28/21 260 lb 8 oz (118.2 kg)  02/20/21 264 lb 3.2 oz (119.8 kg)  02/14/21 263 lb 9.6 oz (119.6 kg)    BP 130/80   Pulse 76   Temp (!) 97.5 F (36.4 C) (Temporal)   Ht 6\' 3"  (1.905 m)   Wt 260 lb 8 oz (118.2 kg)   SpO2 96%   BMI 32.56 kg/m    Physical Exam Constitutional:      Appearance: Normal appearance. He is not ill-appearing or diaphoretic.  HENT:     Right Ear: External ear normal.     Left Ear: External ear normal.  Eyes:     General: No scleral icterus.    Extraocular Movements: Extraocular movements intact.     Conjunctiva/sclera: Conjunctivae normal.  Cardiovascular:     Rate and Rhythm: Normal rate.  Pulmonary:     Effort: Pulmonary effort is normal.  Musculoskeletal:     Cervical back: Neck supple.  Skin:    General: Skin is warm and dry.  Neurological:     Mental Status: He is alert. Mental status is at baseline.  Psychiatric:        Mood and Affect: Mood normal.     UA: positive protein, positive blood UA from 4/19: +LE, blood, protein - urine culture <50,000 units  4/25: H/H 16.5/47     Assessment & Plan:   Problem List Items Addressed This Visit      Cardiovascular and Mediastinum   Essential hypertension     BP at goal. Cont healthy diet and attempts a smoking cessation.       Relevant Orders   Basic metabolic panel     Genitourinary   Gross hematuria - Primary    Several episodes of gross hematuria last one on 4/29 while taking antibiotics for presumed UTI however, UCx was negative. Discussed risk factors male, 45 yo, smoker and would recommend urology referral for further evaluation. Will check BMP to evaluate kidney function as last checked 12/2020. CBC was stable 5 days after initial hematuria so no need to repeat at this time since gross hematuria has resolved.       Relevant Orders   Ambulatory referral to Urology   Basic metabolic panel     Other   Pulmonary nodule    Initially noted 11/2018 and repeat 10/2019 with 4 mm nodule. Given current smoker he is high risk. Repeat CT lung ordered today.       Relevant Orders   CT Chest W Contrast    Other Visit Diagnoses    Hematuria, unspecified type  Relevant Orders   POCT Urinalysis Dipstick (Automated) (Completed)       Return if symptoms worsen or fail to improve.  Lesleigh Noe, MD  This visit occurred during the SARS-CoV-2 public health emergency.  Safety protocols were in place, including screening questions prior to the visit, additional usage of staff PPE, and extensive cleaning of exam room while observing appropriate contact time as indicated for disinfecting solutions.

## 2021-02-28 NOTE — Assessment & Plan Note (Signed)
Initially noted 11/2018 and repeat 10/2019 with 4 mm nodule. Given current smoker he is high risk. Repeat CT lung ordered today.

## 2021-03-01 ENCOUNTER — Ambulatory Visit: Payer: BC Managed Care – PPO | Admitting: Family Medicine

## 2021-03-06 ENCOUNTER — Ambulatory Visit: Payer: BC Managed Care – PPO | Admitting: Family Medicine

## 2021-03-22 ENCOUNTER — Telehealth: Payer: Self-pay

## 2021-03-22 DIAGNOSIS — R911 Solitary pulmonary nodule: Secondary | ICD-10-CM

## 2021-03-22 NOTE — Telephone Encounter (Signed)
CT w/o contrast ordered.   Can cancel the one with contrast.

## 2021-03-22 NOTE — Telephone Encounter (Signed)
Ben with CT at [facility] said that pt is on Ben's schedule for 03/23/21 for CT chest with contrast for 1 year FU lung nodule. Suezanne Jacquet said usually this type study is done without contrast and due to shortage of IV contrast Ben request cb after Dr Cody's review of this note to see if Dr Einar Pheasant wants to order CT chest with or without contrast. Sending note to Dr Einar Pheasant and [facility] CMA.

## 2021-03-22 NOTE — Telephone Encounter (Signed)
Spoke with Suezanne Jacquet at [facility], order for Chest CT without contrast was already ordered and approved earlier this month. The one ordered in this note is incorrect. Cancelling. Nothing else is needed at this time. Patient is still scheduled for his appointment for tomorrow morning at [facility]

## 2021-03-22 NOTE — Addendum Note (Signed)
Addended by: Kris Mouton on: 03/22/2021 02:16 PM   Modules accepted: Orders

## 2021-03-23 ENCOUNTER — Ambulatory Visit: Admission: RE | Admit: 2021-03-23 | Payer: BC Managed Care – PPO | Source: Ambulatory Visit

## 2021-05-22 ENCOUNTER — Other Ambulatory Visit: Payer: BC Managed Care – PPO

## 2021-05-22 ENCOUNTER — Ambulatory Visit: Payer: BC Managed Care – PPO | Admitting: Hematology and Oncology

## 2021-06-12 ENCOUNTER — Inpatient Hospital Stay: Payer: BC Managed Care – PPO

## 2021-06-12 ENCOUNTER — Inpatient Hospital Stay: Payer: BC Managed Care – PPO | Admitting: Hematology and Oncology

## 2021-06-12 ENCOUNTER — Encounter: Payer: Self-pay | Admitting: Hematology and Oncology

## 2021-06-12 ENCOUNTER — Inpatient Hospital Stay: Payer: BC Managed Care – PPO | Attending: Hematology and Oncology

## 2021-07-17 ENCOUNTER — Encounter: Payer: Self-pay | Admitting: Nurse Practitioner

## 2021-07-17 ENCOUNTER — Telehealth: Payer: Self-pay | Admitting: *Deleted

## 2021-07-17 ENCOUNTER — Ambulatory Visit (INDEPENDENT_AMBULATORY_CARE_PROVIDER_SITE_OTHER): Payer: 59 | Admitting: Nurse Practitioner

## 2021-07-17 ENCOUNTER — Encounter: Payer: Self-pay | Admitting: Hematology and Oncology

## 2021-07-17 ENCOUNTER — Other Ambulatory Visit: Payer: Self-pay

## 2021-07-17 ENCOUNTER — Ambulatory Visit
Admission: RE | Admit: 2021-07-17 | Discharge: 2021-07-17 | Disposition: A | Discharge status: Home / Self Care | Payer: 59 | Source: Ambulatory Visit | Attending: Nurse Practitioner | Admitting: Nurse Practitioner

## 2021-07-17 VITALS — BP 126/72 | HR 76 | Temp 98.1°F | Resp 16 | Ht 75.0 in | Wt 262.2 lb

## 2021-07-17 DIAGNOSIS — R350 Frequency of micturition: Secondary | ICD-10-CM

## 2021-07-17 DIAGNOSIS — R1032 Left lower quadrant pain: Secondary | ICD-10-CM | POA: Insufficient documentation

## 2021-07-17 DIAGNOSIS — R319 Hematuria, unspecified: Secondary | ICD-10-CM | POA: Diagnosis not present

## 2021-07-17 LAB — CBC
HCT: 46.8 % (ref 39.0–52.0)
Hemoglobin: 16 g/dL (ref 13.0–17.0)
MCHC: 34.1 g/dL (ref 30.0–36.0)
MCV: 93.5 fl (ref 78.0–100.0)
Platelets: 408 10*3/uL — ABNORMAL HIGH (ref 150.0–400.0)
RBC: 5.01 Mil/uL (ref 4.22–5.81)
RDW: 13.5 % (ref 11.5–15.5)
WBC: 15.8 10*3/uL — ABNORMAL HIGH (ref 4.0–10.5)

## 2021-07-17 LAB — POCT URINALYSIS DIP (CLINITEK)
Bilirubin, UA: NEGATIVE
Glucose, UA: NEGATIVE mg/dL
Ketones, POC UA: NEGATIVE mg/dL
Leukocytes, UA: NEGATIVE
Nitrite, UA: NEGATIVE
POC PROTEIN,UA: NEGATIVE
Spec Grav, UA: 1.01 (ref 1.010–1.025)
Urobilinogen, UA: 0.2 E.U./dL
pH, UA: 7.5 (ref 5.0–8.0)

## 2021-07-17 LAB — BASIC METABOLIC PANEL
BUN: 15 mg/dL (ref 6–23)
CO2: 28 mEq/L (ref 19–32)
Calcium: 9.1 mg/dL (ref 8.4–10.5)
Chloride: 102 mEq/L (ref 96–112)
Creatinine, Ser: 1.16 mg/dL (ref 0.40–1.50)
GFR: 75.03 mL/min (ref >60.00)
Glucose, Bld: 110 mg/dL — ABNORMAL HIGH (ref 70–99)
Potassium: 4.9 mEq/L (ref 3.5–5.1)
Sodium: 137 mEq/L (ref 135–145)

## 2021-07-17 LAB — URINALYSIS, MICROSCOPIC ONLY

## 2021-07-17 LAB — POCT I-STAT CREATININE: Creatinine, Ser: 1.1 mg/dL (ref 0.61–1.24)

## 2021-07-17 MED ORDER — CIPROFLOXACIN HCL 500 MG PO TABS: 500.0000 mg | ORAL_TABLET | Freq: Two times a day (BID) | ORAL | 0 refills | Status: AC

## 2021-07-17 MED ORDER — METRONIDAZOLE 500 MG PO TABS: 500.0000 mg | ORAL_TABLET | Freq: Three times a day (TID) | ORAL | 0 refills | Status: AC

## 2021-07-17 MED ORDER — IOHEXOL 350 MG/ML SOLN
80.0000 mL | Freq: Once | INTRAVENOUS | Status: AC | PRN
  Administered 2021-07-17: 80 mL via INTRAVENOUS

## 2021-07-17 NOTE — Telephone Encounter (Signed)
Noted. Seen patient in office this morning. Thanks

## 2021-07-17 NOTE — Telephone Encounter (Signed)
PLEASE NOTE: All timestamps contained within this report are represented as [place] Standard Time. CONFIDENTIALTY NOTICE: This fax transmission is intended only for the addressee. It contains information that is legally privileged, confidential or otherwise protected from use or disclosure. If you are not the intended recipient, you are strictly prohibited from reviewing, disclosing, copying using or disseminating any of this information or taking any action in reliance on or regarding this information. If you have received this fax in error, please notify us immediately by telephone so that we can arrange for its return to [place]. Phone: [phone], Toll-Free: [phone], Fax: [phone] Page: 1 of 2 Call Id: [id number] [place] [facility] - Client TELEPHONE ADVICE RECORD [facility] Patient Name: Frank Stevens LDS Gender: Male DOB: 07-23-1976 Age: 45 Y 50 M 14 D Return Phone Number: [phone] (Primary), [phone] (Secondary) Address: City/ State/ Zip: [place] [address] Client [facility] - Client Client Site [place] Physician Waunita Schooner- MD Contact Type Call Who Is Calling Patient / Member / Family / Caregiver Call Type Triage / Clinical Caller Name Hunter Tringale Relationship To Patient Self Return Phone Number [phone] (Primary) Chief Complaint SEVERE ABDOMINAL PAIN - Severe pain in abdomen Reason for Call Symptomatic / Request for [name] states he is having abdominal pain Additional Comment Operator did not have the pt on the line Translation No Nurse Assessment Nurse: Micki Riley, RN, Domenick Gong Date/Time ([place] Time): 07/17/2021 8:26:21 AM Confirm and document reason for call. If symptomatic, describe symptoms. ---Caller states he has been having intermittent abdominal pain. Diagnosed with diverticulitis in 2020. Denies any other symptoms. Does  the patient have any new or worsening symptoms? ---Yes Will a triage be completed? ---Yes Related visit to physician within the last 2 weeks? ---N/A Does the PT have any chronic conditions? (i.e. diabetes, asthma, this includes High risk factors for pregnancy, etc.) ---Unknown Is this a behavioral health or substance abuse call? ---No Guidelines Guideline Title Affirmed Question Affirmed Notes Nurse Date/Time Eilene Ghazi Time) Abdominal Pain - Male [1] MILDMODERATE pain AND [2] comes and goes (cramps) Cazares, RN, Domenick Gong 07/17/2021 8:27:42 AM Disp. Time Eilene Ghazi Time) Disposition Final User 07/17/2021 8:23:05 AM Send to Urgent Queue Vinnie Level 07/17/2021 8:31:32 AM See PCP within 24 Hours Yes Cazares, RN, Vivien Rota Janie PLEASE NOTE: All timestamps contained within this report are represented as [place] Standard Time. CONFIDENTIALTY NOTICE: This fax transmission is intended only for the addressee. It contains information that is legally privileged, confidential or otherwise protected from use or disclosure. If you are not the intended recipient, you are strictly prohibited from reviewing, disclosing, copying using or disseminating any of this information or taking any action in reliance on or regarding this information. If you have received this fax in error, please notify us immediately by telephone so that we can arrange for its return to [place]. Phone: [phone], Toll-Free: [phone], Fax: [phone] Page: 2 of 2 Call Id: [id number] Disposition Overriden: Home Care Override Reason: Patient's symptoms need a higher level of care Caller Disagree/Comply Comply Caller Understands Yes PreDisposition InappropriateToAsk Care Advice Given Per Guideline SEE PCP WITHIN 24 HOURS: CARE ADVICE given per Abdominal Pain, Male (Adult) guideline. CALL BACK IF: * You become worse Referrals GO TO FACILITY UNDECIDED

## 2021-07-17 NOTE — Telephone Encounter (Signed)
Patient scheduled to see Romilda Garret NP today 07/17/21.

## 2021-07-17 NOTE — Assessment & Plan Note (Signed)
Has been complaining of urinary frequency with onset of abdominal symptoms.  UA in office does show 3+ blood.  Looking back at the previous 2 UAs patient also had red blood cells present.  We will send him for microscopy for confirmation.

## 2021-07-17 NOTE — Assessment & Plan Note (Signed)
History of same send off urine for microscopy.  Pending lab results.

## 2021-07-17 NOTE — Patient Instructions (Signed)
Will be in touch regarding your labs and imaging Sent in treatment for diverticulitis

## 2021-07-17 NOTE — Progress Notes (Signed)
Acute Office Visit  Subjective:    Patient ID: Frank Stevens, male    DOB: 1976-09-20, 45 y.o.   MRN: [id number]  Chief Complaint  Patient presents with   Abdominal Pain    Left side pain, started x 1 week ago. Became more intense last night. Bowel movements have changed. Extra gas. No nausea or vomiting.      Patient is in today for Left side pain  Symptoms started approx 1 week ago. States had covid in July. Bowel changes that resolved. Had constipation that started approx 1 week ago. Used OTC dulcolcax.  Pain intermittent, that feels like a knife. Has not had a normal BM over a week. States that the [facility] he has had is not hard. Noticed some blood.  Radiates to his back.    Past Medical History:  Diagnosis Date   Asthma    childhood   GERD (gastroesophageal reflux disease)    Hepatitis    age 81   Pre-diabetes    Pulmonary nodule 12/17/2018   needs follow up CT in 1 year    Past Surgical History:  Procedure Laterality Date   GANGLION CYST EXCISION Left    lymph node     lymph node removed under left arm due to cat scratch fever. as a kid    Family History  Problem Relation Age of Onset   Arthritis Mother    COPD Mother    Diabetes Mother    Heart disease Mother    Colon cancer Mother 21   Congestive Heart Failure Mother    Alcohol abuse Father    Drug abuse Father    Early death Father    Kidney disease Father    Cirrhosis Father    Asthma Sister     Social History   Socioeconomic History   Marital status: Single    Spouse name: Not on file   Number of children: 1   Years of education: GED, associate degree   Highest education level: Not on file  Occupational History   Occupation: works as an Chief Financial Officer  Tobacco Use   Smoking status: Every Day    Packs/day: 2.00    Years: 31.00    Pack years: 62.00    Types: Cigarettes   Smokeless tobacco: Former    Types: Chew   Tobacco comments:    2 ppd x 30 years  Vaping Use   Vaping Use: Former   Start  date: 02/27/2015   Quit date: 10/30/2015  Substance and Sexual Activity   Alcohol use: Yes    Comment: 1-2 times per year   Drug use: Not Currently   Sexual activity: Yes    Birth control/protection: Pill  Other Topics Concern   Not on file  Social History Narrative   Girlfriend - Elmo Putt - lives in [place] - 24 - just finished college   Enjoys - doesn't have a lot time for freetime   Works as a Freight forwarder at [employer]   Exercise: works, not regularly - time is a limiting factor   Diet: not great   Social Determinants of Radio broadcast assistant Strain: Not on file  Food Insecurity: Not on file  Transportation Needs: Not on file  Physical Activity: Not on file  Stress: Not on file  Social Connections: Not on file  Intimate Partner Violence: Not on file    Outpatient Medications Prior to Visit  Medication Sig Dispense Refill  albuterol (VENTOLIN HFA) 108 (90 Base) MCG/ACT inhaler TAKE 2 PUFFS BY MOUTH EVERY 6 HOURS AS NEEDED FOR WHEEZE OR SHORTNESS OF BREATH 8.5 each 1   aspirin EC 81 MG tablet Take 81 mg by mouth daily. Swallow whole.     sildenafil (REVATIO) 20 MG tablet Take 3 tablets PO PRN 10 tablet 1   valACYclovir (VALTREX) 500 MG tablet TAKE 1 TABLET BY MOUTH EVERY DAY 30 tablet 11   aspirin EC 81 MG tablet Take 81 mg by mouth daily. Swallow whole.     No facility-administered medications prior to visit.    Allergies  Allergen Reactions   Penicillins     Vomiting     Review of Systems  Constitutional:  Negative for chills and fever.  Respiratory:  Negative for shortness of breath.   Cardiovascular:  Negative for chest pain.  Gastrointestinal:  Positive for abdominal pain, blood in stool and constipation. Negative for nausea and vomiting.  Genitourinary:  Positive for frequency. Negative for flank pain and hematuria.      Objective:    Physical Exam Vitals and nursing note reviewed.  Constitutional:      Comments:  Looks uncomfortable  Cardiovascular:     Rate and Rhythm: Normal rate.  Pulmonary:     Effort: Pulmonary effort is normal.     Breath sounds: Normal breath sounds.  Abdominal:     General: Bowel sounds are increased. There is no distension. There are no signs of injury.     Palpations: Abdomen is soft. There is no hepatomegaly, splenomegaly or mass.     Tenderness: There is abdominal tenderness in the left lower quadrant. There is no rebound.    Neurological:     Mental Status: He is alert.  Psychiatric:        Mood and Affect: Mood normal.        Behavior: Behavior normal.        Thought Content: Thought content normal.        Judgment: Judgment normal.    BP 126/72   Pulse 76   Temp 98.1 F (36.7 C)   Resp 16   Ht '6\' 3"'$  (1.905 m)   Wt 262 lb 3 oz (118.9 kg)   SpO2 96%   BMI 32.77 kg/m  Wt Readings from Last 3 Encounters:  07/17/21 262 lb 3 oz (118.9 kg)  02/28/21 260 lb 8 oz (118.2 kg)  02/20/21 264 lb 3.2 oz (119.8 kg)    Health Maintenance Due  Topic Date Due   COVID-19 Vaccine (1) Never done   INFLUENZA VACCINE  05/29/2021    There are no preventive care reminders to display for this patient.   Lab Results  Component Value Date   TSH 0.72 02/22/2017   Lab Results  Component Value Date   WBC 11.0 (H) 02/20/2021   HGB 16.5 02/20/2021   HCT 47.0 02/20/2021   MCV 94.2 02/20/2021   PLT 413 (H) 02/20/2021   Lab Results  Component Value Date   NA 138 01/07/2021   K 4.2 01/07/2021   CO2 24 (A) 01/07/2021   GLUCOSE 102 (H) 09/06/2020   BUN 15 01/07/2021   CREATININE 1.0 01/07/2021   BILITOT 0.3 09/06/2020   ALKPHOS 69 09/06/2020   AST 17 09/06/2020   ALT 21 09/06/2020   PROT 7.3 09/06/2020   ALBUMIN 4.4 09/06/2020   CALCIUM 9.3 01/07/2021   ANIONGAP 10 11/02/2019   GFR 89.13 09/06/2020   Lab Results  Component Value  Date   CHOL 184 01/11/2021   Lab Results  Component Value Date   HDL 28.90 (L) 01/11/2021   Lab Results  Component Value  Date   LDLCALC 103 02/22/2017   Lab Results  Component Value Date   TRIG 255.0 (H) 01/11/2021   Lab Results  Component Value Date   CHOLHDL 6 01/11/2021   Lab Results  Component Value Date   HGBA1C 6.0 09/06/2020       Assessment & Plan:   Problem List Items Addressed This Visit       Other   Left lower quadrant abdominal pain - Primary    Patient has history of the same with diagnosis of diverticulitis.  Patient states symptoms started approximately week ago he was constipated took OTC medication had a day without symptoms and now came back with a vengeance.  States that it is has woken him up at night the abdominal pain.  No history of kidney stone.  Patient is having some urinary symptoms along with bowel habit changes this is concerning for bladder irritation and or obstruction, perforation.  We will go ahead and start treatment for diverticulitis obtain stat CT scan of abdomen pelvis to rule out complicating obstruction or perforation. Pending lab results of CBC and BMP.  Pending CT scan      Relevant Medications   ciprofloxacin (CIPRO) 500 MG tablet   metroNIDAZOLE (FLAGYL) 500 MG tablet   Other Relevant Orders   CT Abdomen Pelvis W Contrast   CBC   Basic metabolic panel   Urinary frequency    Has been complaining of urinary frequency with onset of abdominal symptoms.  UA in office does show 3+ blood.  Looking back at the previous 2 UAs patient also had red blood cells present.  We will send him for microscopy for confirmation.      Relevant Orders   POCT URINALYSIS DIP (CLINITEK) (Completed)   Hematuria    History of same send off urine for microscopy.  Pending lab results.      Relevant Orders   Urine Microscopic     No orders of the defined types were placed in this encounter.  This visit occurred during the SARS-CoV-2 public health emergency.  Safety protocols were in place, including screening questions prior to the visit, additional usage of staff PPE,  and extensive cleaning of exam room while observing appropriate contact time as indicated for disinfecting solutions.   Romilda Garret, NP

## 2021-07-17 NOTE — Assessment & Plan Note (Addendum)
Patient has history of the same with diagnosis of diverticulitis.  Patient states symptoms started approximately week ago he was constipated took OTC medication had a day without symptoms and now came back with a vengeance.  States that it is has woken him up at night the abdominal pain.  No history of kidney stone.  Patient is having some urinary symptoms along with bowel habit changes this is concerning for bladder irritation and or obstruction, perforation.  We will go ahead and start treatment for diverticulitis obtain stat CT scan of abdomen pelvis to rule out complicating obstruction or perforation. Pending lab results of CBC and BMP.  Pending CT scan

## 2021-07-21 ENCOUNTER — Telehealth: Payer: Self-pay | Admitting: Nurse Practitioner

## 2021-07-21 NOTE — Telephone Encounter (Signed)
-----   Message from Michela Pitcher, NP sent at 07/21/2021  2:02 PM EDT ----- Regarding: Diverticulitis Patient states that he is improving but still experiencing pain and cramping. He has been able to pass BMs now. Will have him follow up with me in office on Monday for a recheck.

## 2021-07-24 ENCOUNTER — Ambulatory Visit (INDEPENDENT_AMBULATORY_CARE_PROVIDER_SITE_OTHER): Payer: 59 | Admitting: Nurse Practitioner

## 2021-07-24 ENCOUNTER — Other Ambulatory Visit: Payer: Self-pay

## 2021-07-24 ENCOUNTER — Encounter: Payer: Self-pay | Admitting: Nurse Practitioner

## 2021-07-24 VITALS — BP 132/84 | HR 84 | Temp 98.2°F | Resp 14 | Ht 75.0 in | Wt 261.1 lb

## 2021-07-24 DIAGNOSIS — R319 Hematuria, unspecified: Secondary | ICD-10-CM | POA: Diagnosis not present

## 2021-07-24 DIAGNOSIS — R8290 Unspecified abnormal findings in urine: Secondary | ICD-10-CM

## 2021-07-24 DIAGNOSIS — K5792 Diverticulitis of intestine, part unspecified, without perforation or abscess without bleeding: Secondary | ICD-10-CM | POA: Diagnosis not present

## 2021-07-24 LAB — CBC
HCT: 48.4 % (ref 39.0–52.0)
Hemoglobin: 16.4 g/dL (ref 13.0–17.0)
MCHC: 34 g/dL (ref 30.0–36.0)
MCV: 93.2 fl (ref 78.0–100.0)
Platelets: 429 10*3/uL — ABNORMAL HIGH (ref 150.0–400.0)
RBC: 5.2 Mil/uL (ref 4.22–5.81)
RDW: 13.5 % (ref 11.5–15.5)
WBC: 11.1 10*3/uL — ABNORMAL HIGH (ref 4.0–10.5)

## 2021-07-24 LAB — POCT URINALYSIS DIP (CLINITEK)
Bilirubin, UA: NEGATIVE
Glucose, UA: NEGATIVE mg/dL
Ketones, POC UA: NEGATIVE mg/dL
Nitrite, UA: NEGATIVE
POC PROTEIN,UA: NEGATIVE
Spec Grav, UA: 1.015 (ref 1.010–1.025)
Urobilinogen, UA: 0.2 E.U./dL
pH, UA: 6 (ref 5.0–8.0)

## 2021-07-24 LAB — BASIC METABOLIC PANEL
BUN: 11 mg/dL (ref 6–23)
CO2: 27 mEq/L (ref 19–32)
Calcium: 8.7 mg/dL (ref 8.4–10.5)
Chloride: 103 mEq/L (ref 96–112)
Creatinine, Ser: 1.18 mg/dL (ref 0.40–1.50)
GFR: 73.5 mL/min (ref >60.00)
Glucose, Bld: 99 mg/dL (ref 70–99)
Potassium: 4.2 mEq/L (ref 3.5–5.1)
Sodium: 135 mEq/L (ref 135–145)

## 2021-07-24 LAB — URINALYSIS, MICROSCOPIC ONLY

## 2021-07-24 NOTE — Assessment & Plan Note (Addendum)
Diagnosed on CT scan.  Has uncomplicated diverticulitis.  Patient was started on ciprofloxacin and metronidazole.  Patient been taking medications as prescribed.  States he started feeling better for few days and over the past couple he is got back to feeling bad.  He is able to have bowel movements (described as drops without catharsis) and then was not able to before .  He will continue taking antibiotics we will recheck CBC today to make sure white count heading in the right direction still having tenderness in left lower quadrant with guarding.  Will reach out to GI professional Dr. Tarri Glenn to see if she has any recommendations.  Patient giving signs and symptoms as when to seek emergent care in regards to diverticulitis.  Continue to monitor  Addend: GI states if not having improvement with clinical symptoms of diverticulitis another CT should be ordered to check for complicating factors.

## 2021-07-24 NOTE — Patient Instructions (Signed)
Will be in touch about next steps Continue taking antibiotics.

## 2021-07-24 NOTE — Progress Notes (Addendum)
Established Patient Office Visit  Subjective:  Patient ID: Frank Stevens, male    DOB: June 01, 1976  Age: 45 y.o. MRN: [id number]  CC:  Chief Complaint  Patient presents with   Abdominal Pain    Follow up. Pain getting worse again, a lot of gas. Pain is present in lower back-bilateral area also. Difficulty urinating.     HPI Frank Stevens presents for diverticulitis  States that yesterday was a bad day. States the pain is cramping and sharp stabbing. That is intermittent. Feels like he is having bladder irration   Bowel movements are not cathartic. Feels like he has to go and not getting much out. He has taken stool softeners in the past, has not taken them since dx. No mucous or blood in stool that he can tell. Not hard but not diarrhea either. Lots of gas. States that he is peeing once at night which is new with the dx of diverticulitis States that he had an improvement for a few days then returned, even while taking the antibiotics. States that he feels better today than yesterday but still not feeling well.  He has continued to take the antibiotics as prescribed.   Past Medical History:  Diagnosis Date   Asthma    childhood   GERD (gastroesophageal reflux disease)    Hepatitis    age 35   Pre-diabetes    Pulmonary nodule 12/17/2018   needs follow up CT in 1 year    Past Surgical History:  Procedure Laterality Date   GANGLION CYST EXCISION Left    lymph node     lymph node removed under left arm due to cat scratch fever. as a kid    Family History  Problem Relation Age of Onset   Arthritis Mother    COPD Mother    Diabetes Mother    Heart disease Mother    Colon cancer Mother 46   Congestive Heart Failure Mother    Alcohol abuse Father    Drug abuse Father    Early death Father    Kidney disease Father    Cirrhosis Father    Asthma Sister     Social History   Socioeconomic History   Marital status: Single    Spouse name: Not on file   Number of children:  1   Years of education: GED, associate degree   Highest education level: Not on file  Occupational History   Occupation: works as an Chief Financial Officer  Tobacco Use   Smoking status: Every Day    Packs/day: 2.00    Years: 31.00    Pack years: 62.00    Types: Cigarettes   Smokeless tobacco: Former    Types: Chew   Tobacco comments:    2 ppd x 30 years  Vaping Use   Vaping Use: Former   Start date: 02/27/2015   Quit date: 10/30/2015  Substance and Sexual Activity   Alcohol use: Yes    Comment: 1-2 times per year   Drug use: Not Currently   Sexual activity: Yes    Birth control/protection: Pill  Other Topics Concern   Not on file  Social History Narrative   Girlfriend - Elmo Putt - lives in [place] - 24 - just finished college   Enjoys - doesn't have a lot time for freetime   Works as a Freight forwarder at [employer]   Exercise: works, not regularly - time is a limiting factor   Diet:  not great   Social Determinants of Health   Financial Resource Strain: Not on file  Food Insecurity: Not on file  Transportation Needs: Not on file  Physical Activity: Not on file  Stress: Not on file  Social Connections: Not on file  Intimate Partner Violence: Not on file    Outpatient Medications Prior to Visit  Medication Sig Dispense Refill   albuterol (VENTOLIN HFA) 108 (90 Base) MCG/ACT inhaler TAKE 2 PUFFS BY MOUTH EVERY 6 HOURS AS NEEDED FOR WHEEZE OR SHORTNESS OF BREATH 8.5 each 1   aspirin EC 81 MG tablet Take 81 mg by mouth daily. Swallow whole.     ciprofloxacin (CIPRO) 500 MG tablet Take 1 tablet (500 mg total) by mouth 2 (two) times daily for 10 days. 20 tablet 0   metroNIDAZOLE (FLAGYL) 500 MG tablet Take 1 tablet (500 mg total) by mouth 3 (three) times daily for 10 days. 30 tablet 0   sildenafil (REVATIO) 20 MG tablet Take 3 tablets PO PRN 10 tablet 1   valACYclovir (VALTREX) 500 MG tablet TAKE 1 TABLET BY MOUTH EVERY DAY 30 tablet 11   No  facility-administered medications prior to visit.    Allergies  Allergen Reactions   Penicillins     Vomiting     ROS Review of Systems  Constitutional:  Negative for chills and fever.  Respiratory:  Negative for shortness of breath.   Cardiovascular:  Negative for chest pain.  Gastrointestinal:  Positive for abdominal pain, constipation and nausea. Negative for vomiting.  Genitourinary:  Negative for decreased urine volume and hematuria.       Nocturia x 1  Skin:  Negative for color change and pallor.     Objective:    Physical Exam Vitals and nursing note reviewed.  Constitutional:      Appearance: Normal appearance.  Cardiovascular:     Rate and Rhythm: Normal rate and regular rhythm.  Pulmonary:     Effort: Pulmonary effort is normal.     Breath sounds: Normal breath sounds.  Abdominal:     General: Bowel sounds are normal. There is no distension.     Tenderness: There is abdominal tenderness in the left lower quadrant. There is guarding.  Skin:    General: Skin is warm.  Neurological:     General: No focal deficit present.     Mental Status: He is alert.  Psychiatric:        Mood and Affect: Mood normal.        Behavior: Behavior normal.    BP 132/84   Pulse 84   Temp 98.2 F (36.8 C)   Resp 14   Ht 6\' 3"  (1.905 m)   Wt 261 lb 2 oz (118.4 kg)   SpO2 96%   BMI 32.64 kg/m  Wt Readings from Last 3 Encounters:  07/24/21 261 lb 2 oz (118.4 kg)  07/17/21 262 lb 3 oz (118.9 kg)  02/28/21 260 lb 8 oz (118.2 kg)     Health Maintenance Due  Topic Date Due   COVID-19 Vaccine (4 - Booster for Moderna series) 12/29/2020   INFLUENZA VACCINE  05/29/2021    There are no preventive care reminders to display for this patient.  Lab Results  Component Value Date   TSH 0.72 02/22/2017   Lab Results  Component Value Date   WBC 15.8 (H) 07/17/2021   HGB 16.0 07/17/2021   HCT 46.8 07/17/2021   MCV 93.5 07/17/2021   PLT 408.0 (H) 07/17/2021  Lab Results   Component Value Date   NA 137 07/17/2021   K 4.9 07/17/2021   CO2 28 07/17/2021   GLUCOSE 110 (H) 07/17/2021   BUN 15 07/17/2021   CREATININE 1.10 07/17/2021   BILITOT 0.3 09/06/2020   ALKPHOS 69 09/06/2020   AST 17 09/06/2020   ALT 21 09/06/2020   PROT 7.3 09/06/2020   ALBUMIN 4.4 09/06/2020   CALCIUM 9.1 07/17/2021   ANIONGAP 10 11/02/2019   GFR 75.03 07/17/2021   Lab Results  Component Value Date   CHOL 184 01/11/2021   Lab Results  Component Value Date   HDL 28.90 (L) 01/11/2021   Lab Results  Component Value Date   LDLCALC 103 02/22/2017   Lab Results  Component Value Date   TRIG 255.0 (H) 01/11/2021   Lab Results  Component Value Date   CHOLHDL 6 01/11/2021   Lab Results  Component Value Date   HGBA1C 6.0 09/06/2020      Assessment & Plan:   Problem List Items Addressed This Visit       Other   Hematuria - Primary   Relevant Orders   POCT URINALYSIS DIP (CLINITEK) (Completed)   Urine Culture   Urine Microscopic (Completed)   Diverticulitis    Diagnosed on CT scan.  Has uncomplicated diverticulitis.  Patient was started on ciprofloxacin and metronidazole.  Patient been taking medications as prescribed.  States he started feeling better for few days and over the past couple he is got back to feeling bad.  He is able to have bowel movements (described as drops without catharsis) and then was not able to before .  He will continue taking antibiotics we will recheck CBC today to make sure white count heading in the right direction still having tenderness in left lower quadrant with guarding.  Will reach out to GI professional Dr. Tarri Glenn to see if she has any recommendations.  Patient giving signs and symptoms as when to seek emergent care in regards to diverticulitis.  Continue to monitor  Addend: GI states if not having improvement with clinical symptoms of diverticulitis another CT should be ordered to check for complicating factors.      Relevant  Orders   CBC (Completed)   Basic metabolic panel (Completed)   Abnormal urine    Some hematuria.  But also some leukocytes we will send off for culture for confirmation.  Cipro should cover UTI etiology regardless.  Continue medications for diverticulitis.      Relevant Orders   Urine Culture   Urine Microscopic (Completed)    No orders of the defined types were placed in this encounter.   Follow-up: No follow-ups on file.   This visit occurred during the SARS-CoV-2 public health emergency.  Safety protocols were in place, including screening questions prior to the visit, additional usage of staff PPE, and extensive cleaning of exam room while observing appropriate contact time as indicated for disinfecting solutions.   Romilda Garret, NP

## 2021-07-24 NOTE — Assessment & Plan Note (Signed)
Some hematuria.  But also some leukocytes we will send off for culture for confirmation.  Cipro should cover UTI etiology regardless.  Continue medications for diverticulitis.

## 2021-07-25 ENCOUNTER — Telehealth: Payer: Self-pay

## 2021-07-25 ENCOUNTER — Other Ambulatory Visit: Payer: Self-pay | Admitting: Nurse Practitioner

## 2021-07-25 DIAGNOSIS — K5792 Diverticulitis of intestine, part unspecified, without perforation or abscess without bleeding: Secondary | ICD-10-CM

## 2021-07-25 LAB — URINE CULTURE
MICRO NUMBER:: 12422394
Result:: NO GROWTH
SPECIMEN QUALITY:: ADEQUATE

## 2021-07-25 NOTE — Telephone Encounter (Signed)
Pt scheduled as follows:  Appointment Information  Name: Frank Stevens, Frank Stevens" MRN: [id number]  Date: 07/26/2021 Status: Sch  Time: 8:30 AM Length: 20  Visit Type: FOLLOW UP 20 [336] Copay: $0.00  Provider: Thornton Park, MD Department: [facility]  Referring Provider: Lesleigh Noe CSN: [id number]  Notes: Diverticulitis/ae  Made On: 07/25/2021 8:32 AM By: Merlene Pulling

## 2021-07-25 NOTE — Telephone Encounter (Signed)
-----   Message from Thornton Park, MD sent at 07/24/2021  8:43 PM EDT ----- Regarding: FW: Diverticuliits Aimee, please schedule follow-up appointment with me or any APP - next available. Thank you.  KLB ----- Message ----- From: Michela Pitcher, NP Sent: 07/24/2021  11:17 AM EDT To: Thornton Park, MD Subject: Diverticuliits                                 Dr. Tarri Glenn,  Frank Stevens last week got ct scan and started treatment for diverticulitis. He got better the first few days and then relapsed there after. Yesterday was a bad day feeling some what better today. Still tender with guarding on exam. He is on day 7/10 of his abx treatment. Didn't know if you would have recommendations? Wouldn't think we would need to re-scan him but wanted to reach out. Thanks for your time  Tish Frederickson, NP

## 2021-07-25 NOTE — Progress Notes (Signed)
Called and spoke with patient about Dr. Tarri Glenn recommendations. Will obtain CT Scan as he has been on treatment for 7 days and not had much improvement. Still having pain and guarding on exam

## 2021-07-26 ENCOUNTER — Encounter: Payer: Self-pay | Admitting: Gastroenterology

## 2021-07-26 ENCOUNTER — Ambulatory Visit (INDEPENDENT_AMBULATORY_CARE_PROVIDER_SITE_OTHER): Payer: 59 | Admitting: Gastroenterology

## 2021-07-26 VITALS — BP 120/77 | HR 87 | Ht 75.0 in | Wt 260.0 lb

## 2021-07-26 DIAGNOSIS — K5732 Diverticulitis of large intestine without perforation or abscess without bleeding: Secondary | ICD-10-CM

## 2021-07-26 DIAGNOSIS — R194 Change in bowel habit: Secondary | ICD-10-CM | POA: Diagnosis not present

## 2021-07-26 MED ORDER — ALIGN PO CAPS: 1.0000 | ORAL_CAPSULE | Freq: Every day | ORAL | 0 refills | Status: DC

## 2021-07-26 NOTE — Progress Notes (Signed)
Referring Provider: Lesleigh Noe, MD Primary Care Physician:  Lesleigh Noe, MD  Chief complaint:  Diverticulitis   IMPRESSION:  Recurrent sigmoid diverticulitis    - sigmoid diverticulitis with associated pneumoperitoneum 11/2018    - recurrent symptoms with CT scan showing sigmoid diverticulitis this month Leukocytosis Thrombocytosis History of colon polyp    - tubular adenoma and hyperplastic polyp on colonoscopy 04/07/19    - surveillance recommended in 2025 given the family history History of rectal bleeding from hemorrhoids Family history of colon cancer (mother at 81) History of tobacco habituation  Subpleural lung nodules - in surveillance with Dr. Einar Pheasant   Recurrent sigmoid diverticulitis. History of complicated diverticulitis in 2020. WBC has improved but symptoms persist despite antibiotics. I personally reviewed the images from his CT scan 07/17/21. Follow-up CT recommended to evaluate for development of complications if his symptoms do not improve.   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 However, low threshold to repeat colonoscopy if symptoms are not improving to exclude colitis. The Flagyl may treat some possible SIBO. He will reach out of symptoms are not improving to arrange for colonoscopy.     PLAN: - Will complete antibiotics - Daily probiotics (prescription requested by the patient) - High fiber diet recommended - Avoid NSAIDs - Daily stool bulking agent like psyllium recommended - Follow-up by MyChart in 2-3 days - CT abd/pelvis with contrast if symptoms are not improving at that time   HPI: Frank Stevens is a 45 y.o. male known to me for a history of diverticulitis.  Last seen in 2020. Worked in this week when his PA asked for assistance with management of acute diverticulitis. He had CT documented sigmoid diverticulitis with associated pneumoperitoneum in 2020 treated with Cipro and Flagyl.   Has had altered bowel  habits following Covid in June. His girlfriend made a TikTok about how altered his bowel habits have been with loose stools followed by profound constipation.  Developed recurrent symptoms last week in the same location of the left lower quadrant as his diveritculitis in 2020. Associated cramping and flatus. WBC 15.8. CT 1/82/99 showed uncomplicated sigmoid diverticulitis. He has had persistent symptoms despite ciprofloxacin and metronidazole. He is completing 10 days of treatment, noting that he may have missed a couple of doses at the appropriate times. CBC 9/26 showed WBC down to 11.1. Platelets remain elevated at 429.  Colonoscopy 04/07/19 showed left sided diverticulosis, a tubular adenoma, hyperplastic polyp, and normal right and left sided colon biopsies.  Past Medical History:  Diagnosis Date   Asthma    childhood   GERD (gastroesophageal reflux disease)    Hepatitis    age 73   Pre-diabetes    Pulmonary nodule 12/17/2018   needs follow up CT in 1 year    Past Surgical History:  Procedure Laterality Date   GANGLION CYST EXCISION Left    lymph node     lymph node removed under left arm due to cat scratch fever. as a kid    Current Outpatient Medications  Medication Sig Dispense Refill   albuterol (VENTOLIN HFA) 108 (90 Base) MCG/ACT inhaler TAKE 2 PUFFS BY MOUTH EVERY 6 HOURS AS NEEDED FOR WHEEZE OR SHORTNESS OF BREATH 8.5 each 1   aspirin EC 81 MG tablet Take 81 mg by mouth daily. Swallow whole.     ciprofloxacin (CIPRO) 500 MG tablet Take 1 tablet (500 mg total) by mouth 2 (two) times daily for 10 days. [place]  tablet 0   metroNIDAZOLE (FLAGYL) 500 MG tablet Take 1 tablet (500 mg total) by mouth 3 (three) times daily for 10 days. 30 tablet 0   sildenafil (REVATIO) 20 MG tablet Take 3 tablets PO PRN 10 tablet 1   valACYclovir (VALTREX) 500 MG tablet TAKE 1 TABLET BY MOUTH EVERY DAY 30 tablet 11   No current facility-administered medications for this visit.    Allergies as of  07/26/2021 - Review Complete 07/24/2021  Allergen Reaction Noted   Penicillins  03/06/2016    Family History  Problem Relation Age of Onset   Arthritis Mother    COPD Mother    Diabetes Mother    Heart disease Mother    Colon cancer Mother 50   Congestive Heart Failure Mother    Alcohol abuse Father    Drug abuse Father    Early death Father    Kidney disease Father    Cirrhosis Father    Asthma Sister     Physical Exam: General:   Alert,  well-nourished, pleasant and cooperative in NAD.  Eyes are open. Head:  Normocephalic and atraumatic. Eyes:  Sclera clear, no icterus.   Conjunctiva pink. Ears:  Normal auditory acuity. Nose:  No deformity, discharge,  or lesions. Mouth:  No deformity or lesions.   Neck:  Supple; no masses or thyromegaly. Lungs:  Clear throughout to auscultation.   No wheezes. Heart:  Regular rate and rhythm; no murmurs. Abdomen:  Soft, no tenderness in the LLQ in the area that he has recently had pain, nondistended, normal bowel sounds, no rebound or guarding. No hepatosplenomegaly.  No obvious ascites.  No abdominal wall hernias.  No succession splash. Rectal:  Deferred  Msk:  Symmetrical. No boney deformities LAD: No inguinal or umbilical LAD Extremities:  No clubbing or edema. Neurologic:  Alert and  oriented x4;  grossly nonfocal Skin:  Intact without significant lesions or rashes. Psych:  Alert and cooperative. Normal mood and affect.    Kayti Poss L. Tarri Glenn, MD, MPH [facility] 07/26/2021, 8:34 AM

## 2021-07-26 NOTE — Patient Instructions (Signed)
If you are age 47 or older, your body mass index should be between 23-30. Your Body mass index is 32.5 kg/m. If this is out of the aforementioned range listed, please consider follow up with your Primary Care Provider.  If you are age 53 or younger, your body mass index should be between 19-25. Your Body mass index is 32.5 kg/m. If this is out of the aformentioned range listed, please consider follow up with your Primary Care Provider.   __________________________________________________________  Please contact the office with an update on Friday 07-28-2021.    The [place] [facility] providers would like to encourage you to use [facility] to communicate with providers for non-urgent requests or questions.  Due to long hold times on the telephone, sending your provider a message by [facility] may be a faster and more efficient way to get a response.  Please allow 48 business hours for a response.  Please remember that this is for non-urgent requests.   Thank you for trusting me with your gastrointestinal care!    Thornton Park, MD, MPH

## 2021-07-27 ENCOUNTER — Telehealth: Payer: Self-pay

## 2021-07-27 NOTE — Telephone Encounter (Signed)
Frank Stevens from [employer] called regarding referral for CT. She has questions and needs clarification on the order. She can be reached at [phone].

## 2021-07-27 NOTE — Telephone Encounter (Signed)
Spoke with [place] She was verifying that we indeed needed a repeat CT scan and that this was not an erroneous fax.   She is going to work on his Auth and will send over confirmation of approval as soon as she can.   Will send to Anastasiya to keep an eye out for this approval as it is coming to the main fax.

## 2021-07-27 NOTE — Telephone Encounter (Signed)
Spoke with patient today to follow up. Advised patient that we have not heard from his insurance company yet in regards to the review for authorization for CT/abd CT scan. Advised patient that Romilda Garret reviewed GI office note from 07/26/21 when patient saw Dr Tarri Glenn and it looks like their office will be following up with patient on whether to proceed with CT scan still or not. Patient stated he is suppose to let Dr Tarri Glenn know how he is doing on 07/28/21 and proceed from there. Patient states he is doing much better today so far. Patient was advised if his CT scan gets approved we will keep that authorization on file in case he needs the scan at a later time. Will not schedule anything at this time. Patient agreed and verbalized understanding.

## 2021-07-31 NOTE — Telephone Encounter (Signed)
This was finally approved by insurance.   Per [place] we are holding off on CT scan at this time due to patient seeing GI.  Treatment plan was made for patient with GI and patient is to let GI know his progress. As of 07/28/21 patient was improving.   Approval is on file until Dec 2022. Patient is aware to keep Matt and GI in the loop of his symptoms and we can schedule at later date if needed.  Nothing further needed.    FYI to Romilda Garret, NP

## 2021-08-01 ENCOUNTER — Ambulatory Visit: Payer: 59

## 2021-08-09 ENCOUNTER — Other Ambulatory Visit: Payer: Self-pay

## 2021-08-09 DIAGNOSIS — D751 Secondary polycythemia: Secondary | ICD-10-CM

## 2021-08-09 DIAGNOSIS — I10 Essential (primary) hypertension: Secondary | ICD-10-CM

## 2021-08-09 DIAGNOSIS — R140 Abdominal distension (gaseous): Secondary | ICD-10-CM

## 2021-08-09 DIAGNOSIS — K5732 Diverticulitis of large intestine without perforation or abscess without bleeding: Secondary | ICD-10-CM

## 2021-08-09 DIAGNOSIS — R141 Gas pain: Secondary | ICD-10-CM

## 2021-08-09 DIAGNOSIS — R319 Hematuria, unspecified: Secondary | ICD-10-CM

## 2021-08-09 NOTE — Telephone Encounter (Signed)
Spoke to patient by telephone and was advised that he had severe gas pain Monday night and it was so bad that he started to go to the ER. Patient stated that he felt better yesterday, but not feeling good again today. Patient stated that he had a spell a couple of weeks ago. And saw his GI doctor.  Patient stated that he has a history of diverticulitis. Patient was advised that he should reach out to his gastroenterologist regarding his ongoing symptoms. Patient stated that he will call and talk with Dr. Tarri Glenn office today about his symptoms. Patient was given ER precautions and he verbalized understanding.Frank Stevens

## 2021-08-09 NOTE — Addendum Note (Signed)
Addended by: Aleatha Borer J on: 08/09/2021 03:38 PM   Modules accepted: Orders

## 2021-08-10 ENCOUNTER — Other Ambulatory Visit: Payer: Self-pay

## 2021-08-10 ENCOUNTER — Other Ambulatory Visit (INDEPENDENT_AMBULATORY_CARE_PROVIDER_SITE_OTHER): Payer: 59

## 2021-08-10 ENCOUNTER — Telehealth: Payer: Self-pay | Admitting: Gastroenterology

## 2021-08-10 DIAGNOSIS — D751 Secondary polycythemia: Secondary | ICD-10-CM

## 2021-08-10 DIAGNOSIS — R140 Abdominal distension (gaseous): Secondary | ICD-10-CM

## 2021-08-10 DIAGNOSIS — K5732 Diverticulitis of large intestine without perforation or abscess without bleeding: Secondary | ICD-10-CM

## 2021-08-10 DIAGNOSIS — R141 Gas pain: Secondary | ICD-10-CM

## 2021-08-10 DIAGNOSIS — R319 Hematuria, unspecified: Secondary | ICD-10-CM

## 2021-08-10 DIAGNOSIS — I10 Essential (primary) hypertension: Secondary | ICD-10-CM

## 2021-08-10 LAB — CBC WITH DIFFERENTIAL/PLATELET
Basophils Absolute: 0.1 10*3/uL (ref 0.0–0.1)
Basophils Relative: 1 % (ref 0.0–3.0)
Eosinophils Absolute: 0.2 10*3/uL (ref 0.0–0.7)
Eosinophils Relative: 1.6 % (ref 0.0–5.0)
HCT: 49.2 % (ref 39.0–52.0)
Hemoglobin: 16.7 g/dL (ref 13.0–17.0)
Lymphocytes Relative: 21.5 % (ref 12.0–46.0)
Lymphs Abs: 2.3 10*3/uL (ref 0.7–4.0)
MCHC: 33.9 g/dL (ref 30.0–36.0)
MCV: 93.7 fl (ref 78.0–100.0)
Monocytes Absolute: 0.7 10*3/uL (ref 0.1–1.0)
Monocytes Relative: 6.9 % (ref 3.0–12.0)
Neutro Abs: 7.4 10*3/uL (ref 1.4–7.7)
Neutrophils Relative %: 69 % (ref 43.0–77.0)
Platelets: 381 10*3/uL (ref 150.0–400.0)
RBC: 5.25 Mil/uL (ref 4.22–5.81)
RDW: 13.7 % (ref 11.5–15.5)
WBC: 10.7 10*3/uL — ABNORMAL HIGH (ref 4.0–10.5)

## 2021-08-10 LAB — COMPREHENSIVE METABOLIC PANEL
ALT: 17 U/L (ref 0–53)
AST: 15 U/L (ref 0–37)
Albumin: 4.6 g/dL (ref 3.5–5.2)
Alkaline Phosphatase: 65 U/L (ref 39–117)
BUN: 14 mg/dL (ref 6–23)
CO2: 27 mEq/L (ref 19–32)
Calcium: 9.5 mg/dL (ref 8.4–10.5)
Chloride: 104 mEq/L (ref 96–112)
Creatinine, Ser: 1.2 mg/dL (ref 0.40–1.50)
GFR: 72.01 mL/min (ref >60.00)
Glucose, Bld: 96 mg/dL (ref 70–99)
Potassium: 4.6 mEq/L (ref 3.5–5.1)
Sodium: 138 mEq/L (ref 135–145)
Total Bilirubin: 0.5 mg/dL (ref 0.2–1.2)
Total Protein: 7.4 g/dL (ref 6.0–8.3)

## 2021-08-10 NOTE — Addendum Note (Signed)
Addended by: Leeanne Rio on: 08/10/2021 10:38 AM   Modules accepted: Orders

## 2021-08-10 NOTE — Telephone Encounter (Signed)
Pt was scheduled for a STAT CT scan for tomorrow; however, imaging called and cancelled and rescheduled for 11/1. Pt concerned that this is not "Stat" and would like advice.  Please call.  Thank you.

## 2021-08-10 NOTE — Telephone Encounter (Signed)
Addressed in previously created encounter 

## 2021-08-11 ENCOUNTER — Ambulatory Visit: Payer: 59

## 2021-08-11 ENCOUNTER — Ambulatory Visit
Admission: RE | Admit: 2021-08-11 | Discharge: 2021-08-11 | Disposition: A | Discharge status: Home / Self Care | Payer: 59 | Source: Ambulatory Visit | Attending: Gastroenterology | Admitting: Gastroenterology

## 2021-08-11 ENCOUNTER — Telehealth: Payer: Self-pay | Admitting: Family Medicine

## 2021-08-11 DIAGNOSIS — R141 Gas pain: Secondary | ICD-10-CM | POA: Diagnosis present

## 2021-08-11 DIAGNOSIS — K5732 Diverticulitis of large intestine without perforation or abscess without bleeding: Secondary | ICD-10-CM

## 2021-08-11 DIAGNOSIS — R140 Abdominal distension (gaseous): Secondary | ICD-10-CM

## 2021-08-11 DIAGNOSIS — N200 Calculus of kidney: Secondary | ICD-10-CM

## 2021-08-11 MED ORDER — IOHEXOL 350 MG/ML SOLN
80.0000 mL | Freq: Once | INTRAVENOUS | Status: AC | PRN
  Administered 2021-08-11: 80 mL via INTRAVENOUS

## 2021-08-11 NOTE — Telephone Encounter (Signed)
It looks like the CT scan was ordered by GI who responded to patient at 4:13 pm with these comments:  "Please call the patient. CT scan shows obstruction due to the left ureterovesicular junction stone. Hopefully he will pass it. However, I recommend that he go to the ED for IV hydration, pain medications, and to facilitate urology follow-up. Thank you."  Agree with recommendations provided by Dr. Tarri Glenn.

## 2021-08-11 NOTE — Telephone Encounter (Signed)
Received message from patient. Have called patient he is in extreme pain. Advised he go to ED for treatment. He has agreed and will go to ED.

## 2021-08-11 NOTE — Telephone Encounter (Signed)
Pt states he had a CT done and he was advised to reach out to PCP to request pain medication as he is currently passing a kidney stone. Medication can be sent in to [facility] [address].

## 2021-08-14 NOTE — Telephone Encounter (Signed)
Agree with ER on a Friday

## 2021-08-14 NOTE — Addendum Note (Signed)
Addended by: Waunita Schooner R on: 08/14/2021 01:02 PM   Modules accepted: Orders

## 2021-08-14 NOTE — Telephone Encounter (Signed)
Please call pt to check in on symptoms - several recommendations to go the ER but cannot see that he went.

## 2021-08-14 NOTE — Telephone Encounter (Signed)
I spoke with pt; pt said he is unemployed and pt did not go to ED. Pt said he thinks he passed kidney stone on 08/11/21 in the evening. As of 08/12/21 pt has not had any pain. Pt said he did speak with GI office and was advised can go to ED or contact PCP. Pt would like to have a urological referral for consult scheduled in [place]. Pt request cb.ED precautions given and pt voiced understanding.sending note to Dr Einar Pheasant and [facility] CMA.

## 2021-08-15 ENCOUNTER — Encounter: Payer: Self-pay | Admitting: Hematology and Oncology

## 2021-08-29 ENCOUNTER — Ambulatory Visit: Payer: 59

## 2021-08-30 ENCOUNTER — Ambulatory Visit: Payer: 59 | Admitting: Urology

## 2021-08-31 ENCOUNTER — Encounter: Payer: Self-pay | Admitting: Urology

## 2021-10-16 ENCOUNTER — Ambulatory Visit: Payer: BC Managed Care – PPO | Admitting: Nurse Practitioner

## 2021-10-16 ENCOUNTER — Encounter: Payer: Self-pay | Admitting: Nurse Practitioner

## 2021-10-16 ENCOUNTER — Other Ambulatory Visit: Payer: Self-pay

## 2021-10-16 VITALS — BP 130/82 | HR 82 | Temp 98.6°F | Ht 75.0 in | Wt 267.0 lb

## 2021-10-16 DIAGNOSIS — M6208 Separation of muscle (nontraumatic), other site: Secondary | ICD-10-CM | POA: Insufficient documentation

## 2021-10-16 DIAGNOSIS — R1012 Left upper quadrant pain: Secondary | ICD-10-CM

## 2021-10-16 LAB — CBC
HCT: 50.2 % (ref 39.0–52.0)
Hemoglobin: 17 g/dL (ref 13.0–17.0)
MCHC: 33.8 g/dL (ref 30.0–36.0)
MCV: 92.9 fl (ref 78.0–100.0)
Platelets: 388 10*3/uL (ref 150.0–400.0)
RBC: 5.41 Mil/uL (ref 4.22–5.81)
RDW: 14 % (ref 11.5–15.5)
WBC: 13.5 10*3/uL — ABNORMAL HIGH (ref 4.0–10.5)

## 2021-10-16 LAB — COMPREHENSIVE METABOLIC PANEL
ALT: 24 U/L (ref 0–53)
AST: 20 U/L (ref 0–37)
Albumin: 4.6 g/dL (ref 3.5–5.2)
Alkaline Phosphatase: 60 U/L (ref 39–117)
BUN: 22 mg/dL (ref 6–23)
CO2: 28 mEq/L (ref 19–32)
Calcium: 10.1 mg/dL (ref 8.4–10.5)
Chloride: 103 mEq/L (ref 96–112)
Creatinine, Ser: 1.17 mg/dL (ref 0.40–1.50)
GFR: 74.13 mL/min (ref >60.00)
Glucose, Bld: 95 mg/dL (ref 70–99)
Potassium: 4.4 mEq/L (ref 3.5–5.1)
Sodium: 139 mEq/L (ref 135–145)
Total Bilirubin: 0.4 mg/dL (ref 0.2–1.2)
Total Protein: 7.9 g/dL (ref 6.0–8.3)

## 2021-10-16 LAB — LIPASE: Lipase: 23 U/L (ref 11.0–59.0)

## 2021-10-16 MED ORDER — OMEPRAZOLE 20 MG PO CPDR: 20.0000 mg | DELAYED_RELEASE_CAPSULE | Freq: Every day | ORAL | 0 refills | Status: DC

## 2021-10-16 NOTE — Progress Notes (Signed)
Acute Office Visit  Subjective:    Patient ID: Frank Stevens, male    DOB: Mar 11, 1976, 45 y.o.   MRN: [id number]  Chief Complaint  Patient presents with   Hernia    When lifting legs had pain in left abdomen started about 2 months ago. Has not increased just persistent. Notices more when siting then standing and after eating. Has bulging in center of abdomen but pressure is on left rib cage area.     HPI Patient is in today for possible hernia and LUQ discomfort  First noticed pressure in the LUQ Thursday noticed that his abdominal muscles pouched up when he was lifting his legs while laying flat. States no change in his bowel or bladder habits or N/V. Does have a history of diverticulitis, GERD and kidney stones. No history of abdominal surgery. Is followed by GI, Dr. Thornton Park.  States that eating does make the discomfort worse in the LUQ. Denies alcohol use regularly.   Does lift heavy things at his work about 50 pounds - 75 pounds.  Past Medical History:  Diagnosis Date   Asthma    childhood   GERD (gastroesophageal reflux disease)    Hepatitis    age 60   Pre-diabetes    Pulmonary nodule 12/17/2018   needs follow up CT in 1 year    Past Surgical History:  Procedure Laterality Date   GANGLION CYST EXCISION Left    lymph node     lymph node removed under left arm due to cat scratch fever. as a kid    Family History  Problem Relation Age of Onset   Arthritis Mother    COPD Mother    Diabetes Mother    Heart disease Mother    Colon cancer Mother 49   Congestive Heart Failure Mother    Alcohol abuse Father    Drug abuse Father    Early death Father    Kidney disease Father    Cirrhosis Father    Asthma Sister     Social History   Socioeconomic History   Marital status: Single    Spouse name: Not on file   Number of children: 1   Years of education: GED, associate degree   Highest education level: Not on file  Occupational History   Occupation:  works as an Chief Financial Officer  Tobacco Use   Smoking status: Every Day    Packs/day: 2.00    Years: 31.00    Pack years: 62.00    Types: Cigarettes   Smokeless tobacco: Former    Types: Chew   Tobacco comments:    2 ppd x 30 years  Vaping Use   Vaping Use: Former   Start date: 02/27/2015   Quit date: 10/30/2015  Substance and Sexual Activity   Alcohol use: Yes    Comment: 1-2 times per year   Drug use: Not Currently   Sexual activity: Yes    Birth control/protection: Pill  Other Topics Concern   Not on file  Social History Narrative   Girlfriend - Elmo Putt - lives in [place] - 24 - just finished college   Enjoys - doesn't have a lot time for freetime   Works as a Freight forwarder at [employer]   Exercise: works, not regularly - time is a limiting factor   Diet: not great   Social Determinants of Radio broadcast assistant Strain: Not on [employer] Insecurity: Not on file  Transportation Needs: Not on file  Physical Activity: Not on file  Stress: Not on file  Social Connections: Not on file  Intimate Partner Violence: Not on file    Outpatient Medications Prior to Visit  Medication Sig Dispense Refill   albuterol (VENTOLIN HFA) 108 (90 Base) MCG/ACT inhaler TAKE 2 PUFFS BY MOUTH EVERY 6 HOURS AS NEEDED FOR WHEEZE OR SHORTNESS OF BREATH 8.5 each 1   aspirin EC 81 MG tablet Take 81 mg by mouth daily. Swallow whole.     valACYclovir (VALTREX) 500 MG tablet TAKE 1 TABLET BY MOUTH EVERY DAY 30 tablet 11   bifidobacterium infantis (ALIGN) capsule Take 1 capsule by mouth daily. (Patient not taking: Reported on 10/16/2021) 30 capsule 0   sildenafil (REVATIO) 20 MG tablet Take 3 tablets PO PRN (Patient not taking: Reported on 10/16/2021) 10 tablet 1   No facility-administered medications prior to visit.    Allergies  Allergen Reactions   Penicillins     Vomiting     Review of Systems  Constitutional:  Positive for fatigue. Negative for chills and  fever.  Respiratory:  Negative for shortness of breath.   Cardiovascular:  Negative for chest pain.  Gastrointestinal:  Positive for abdominal pain (pressure in LUQ). Negative for constipation, diarrhea, nausea and vomiting.      Objective:    Physical Exam Vitals and nursing note reviewed.  Constitutional:      Appearance: Normal appearance.  Cardiovascular:     Rate and Rhythm: Normal rate and regular rhythm.  Pulmonary:     Effort: Pulmonary effort is normal.     Breath sounds: Normal breath sounds.  Abdominal:     General: Bowel sounds are normal.     Palpations: There is no mass.     Tenderness: There is abdominal tenderness in the left upper quadrant. There is no guarding or rebound.     Hernia: No hernia is present.    Skin:    General: Skin is warm.  Neurological:     Mental Status: He is alert.  Psychiatric:        Mood and Affect: Mood normal.        Behavior: Behavior normal.        Thought Content: Thought content normal.        Judgment: Judgment normal.    BP 130/82    Pulse 82    Temp 98.6 F (37 C) (Temporal)    Ht 6\' 3"  (1.905 m)    Wt 267 lb (121.1 kg)    SpO2 94%    BMI 33.37 kg/m  Wt Readings from Last 3 Encounters:  10/16/21 267 lb (121.1 kg)  07/26/21 260 lb (117.9 kg)  07/24/21 261 lb 2 oz (118.4 kg)    Health Maintenance Due  Topic Date Due   Pneumococcal Vaccine 85-60 Years old (1 - PCV) Never done   COVID-19 Vaccine (4 - Booster for Moderna series) 12/01/2020    There are no preventive care reminders to display for this patient.   Lab Results  Component Value Date   TSH 0.72 02/22/2017   Lab Results  Component Value Date   WBC 10.7 (H) 08/10/2021   HGB 16.7 08/10/2021   HCT 49.2 08/10/2021   MCV 93.7 08/10/2021   PLT 381.0 08/10/2021   Lab Results  Component Value Date   NA 138 08/10/2021   K 4.6 08/10/2021   CO2 27 08/10/2021   GLUCOSE 96 08/10/2021   BUN 14 08/10/2021  CREATININE 1.20 08/10/2021   BILITOT 0.5  08/10/2021   ALKPHOS 65 08/10/2021   AST 15 08/10/2021   ALT 17 08/10/2021   PROT 7.4 08/10/2021   ALBUMIN 4.6 08/10/2021   CALCIUM 9.5 08/10/2021   ANIONGAP 10 11/02/2019   GFR 72.01 08/10/2021   Lab Results  Component Value Date   CHOL 184 01/11/2021   Lab Results  Component Value Date   HDL 28.90 (L) 01/11/2021   Lab Results  Component Value Date   LDLCALC 103 02/22/2017   Lab Results  Component Value Date   TRIG 255.0 (H) 01/11/2021   Lab Results  Component Value Date   CHOLHDL 6 01/11/2021   Lab Results  Component Value Date   HGBA1C 6.0 09/06/2020       Assessment & Plan:   Problem List Items Addressed This Visit       Musculoskeletal and Integument   Rectus diastasis of lower abdomen    Patient has slight bulging of abdominal muscles superior to umbilicus medially.  Do think this is rectus diastasis versus hernia yet.  Did discuss signs and symptoms when to seek urgent and emergent health care in regards to this.  Did also recommend proper lifting techniques and using a support belt when he has to lift heavy objects.  We will continue to monitor        Other   LUQ discomfort - Primary    Vague left upper quadrant discomfort.  Patient does not turn his pain although he does have a high tolerance for pain.  States eating does make it worse does have a history of GERD.  We will place him on PPI for 1 month to see if this is of benefit check some basic labs pending lab results.      Relevant Medications   omeprazole (PRILOSEC) 20 MG capsule   Other Relevant Orders   CBC   Comprehensive metabolic panel   Lipase     No orders of the defined types were placed in this encounter.  This visit occurred during the SARS-CoV-2 public health emergency.  Safety protocols were in place, including screening questions prior to the visit, additional usage of staff PPE, and extensive cleaning of exam room while observing appropriate contact time as indicated for  disinfecting solutions.   Romilda Garret, NP

## 2021-10-16 NOTE — Assessment & Plan Note (Signed)
Vague left upper quadrant discomfort.  Patient does not turn his pain although he does have a high tolerance for pain.  States eating does make it worse does have a history of GERD.  We will place him on PPI for 1 month to see if this is of benefit check some basic labs pending lab results.

## 2021-10-16 NOTE — Assessment & Plan Note (Signed)
Patient has slight bulging of abdominal muscles superior to umbilicus medially.  Do think this is rectus diastasis versus hernia yet.  Did discuss signs and symptoms when to seek urgent and emergent health care in regards to this.  Did also recommend proper lifting techniques and using a support belt when he has to lift heavy objects.  We will continue to monitor

## 2021-10-16 NOTE — Patient Instructions (Signed)
I feel that you have a weakening of your abdominal muscles Proper lifting technique will help and using support when lifting will also help If you have changes in BM, the bulge stays out, gets hot to the touch and very painful be evaluated in the ED. Follow up if no improvement

## 2021-12-25 ENCOUNTER — Encounter: Payer: Self-pay | Admitting: Nurse Practitioner

## 2021-12-25 ENCOUNTER — Other Ambulatory Visit: Payer: Self-pay

## 2021-12-25 ENCOUNTER — Ambulatory Visit: Payer: BC Managed Care – PPO | Admitting: Nurse Practitioner

## 2021-12-25 ENCOUNTER — Ambulatory Visit
Admission: RE | Admit: 2021-12-25 | Discharge: 2021-12-25 | Disposition: A | Discharge status: Home / Self Care | Payer: BC Managed Care – PPO | Source: Ambulatory Visit | Attending: Nurse Practitioner | Admitting: Nurse Practitioner

## 2021-12-25 VITALS — BP 130/90 | HR 90 | Temp 97.6°F | Resp 14 | Wt 269.1 lb

## 2021-12-25 DIAGNOSIS — K5792 Diverticulitis of intestine, part unspecified, without perforation or abscess without bleeding: Secondary | ICD-10-CM | POA: Diagnosis not present

## 2021-12-25 DIAGNOSIS — R1032 Left lower quadrant pain: Secondary | ICD-10-CM

## 2021-12-25 DIAGNOSIS — R3129 Other microscopic hematuria: Secondary | ICD-10-CM

## 2021-12-25 DIAGNOSIS — R109 Unspecified abdominal pain: Secondary | ICD-10-CM | POA: Diagnosis not present

## 2021-12-25 DIAGNOSIS — I700 Atherosclerosis of aorta: Secondary | ICD-10-CM | POA: Diagnosis not present

## 2021-12-25 LAB — POCT URINALYSIS DIPSTICK
Bilirubin, UA: NEGATIVE
Glucose, UA: NEGATIVE
Ketones, UA: NEGATIVE
Leukocytes, UA: NEGATIVE
Nitrite, UA: NEGATIVE
Protein, UA: POSITIVE — AB
Spec Grav, UA: >=1.03 — AB (ref 1.010–1.025)
Urobilinogen, UA: 0.2 E.U./dL
pH, UA: 6 (ref 5.0–8.0)

## 2021-12-25 LAB — CBC WITH DIFFERENTIAL/PLATELET
Basophils Absolute: 0.1 10*3/uL (ref 0.0–0.1)
Basophils Relative: 0.5 % (ref 0.0–3.0)
Eosinophils Absolute: 0.2 10*3/uL (ref 0.0–0.7)
Eosinophils Relative: 1.6 % (ref 0.0–5.0)
HCT: 47.8 % (ref 39.0–52.0)
Hemoglobin: 16 g/dL (ref 13.0–17.0)
Lymphocytes Relative: 24.3 % (ref 12.0–46.0)
Lymphs Abs: 3.1 10*3/uL (ref 0.7–4.0)
MCHC: 33.4 g/dL (ref 30.0–36.0)
MCV: 94.2 fl (ref 78.0–100.0)
Monocytes Absolute: 0.9 10*3/uL (ref 0.1–1.0)
Monocytes Relative: 6.6 % (ref 3.0–12.0)
Neutro Abs: 8.7 10*3/uL — ABNORMAL HIGH (ref 1.4–7.7)
Neutrophils Relative %: 67 % (ref 43.0–77.0)
Platelets: 366 10*3/uL (ref 150.0–400.0)
RBC: 5.08 Mil/uL (ref 4.22–5.81)
RDW: 13.6 % (ref 11.5–15.5)
WBC: 12.9 10*3/uL — ABNORMAL HIGH (ref 4.0–10.5)

## 2021-12-25 MED ORDER — CIPROFLOXACIN HCL 500 MG PO TABS: 500.0000 mg | ORAL_TABLET | Freq: Two times a day (BID) | ORAL | 0 refills | Status: AC

## 2021-12-25 MED ORDER — METRONIDAZOLE 500 MG PO TABS: 500.0000 mg | ORAL_TABLET | Freq: Three times a day (TID) | ORAL | 0 refills | Status: DC

## 2021-12-25 MED ORDER — IOHEXOL 350 MG/ML SOLN
100.0000 mL | Freq: Once | INTRAVENOUS | Status: AC | PRN
  Administered 2021-12-25: 100 mL via INTRAVENOUS

## 2021-12-25 NOTE — Patient Instructions (Signed)
Nice to see you today Will be in touch once I have the results of the scan Follow up if no improvement

## 2021-12-25 NOTE — Assessment & Plan Note (Signed)
HX of same. Hx of renal stone

## 2021-12-25 NOTE — Assessment & Plan Note (Signed)
Hx of diverticulitis. States that he is unsure if it is same type of diverticular pain or kidney stone pain. Did discuss with patient and we will get a CT of abdomen and pelvis with contrast along with basic labs

## 2021-12-25 NOTE — Assessment & Plan Note (Signed)
Hx of the same has had several flares and treatments. Patient is complicated by having history of kidney stone with same presentation. Consistently has blood in urine. He has been referred to urology. Pending CT scan. Will treat with Cipro and flagyl

## 2021-12-25 NOTE — Progress Notes (Signed)
Acute Office Visit  Subjective:    Patient ID: Frank Stevens, male    DOB: [birth year], 46 y.o.   MRN: [id number]  Chief Complaint  Patient presents with   Abdominal Pain    LLQ, sx started on 12/23/21, pressure feeling and pain in the back. No urinary discomfort.    Abdominal Pain Pertinent negatives include no diarrhea, fever, headaches, hematuria, nausea or vomiting.  Patient is in today for   Symtpoms started approx Saturday. States that he woke up in sweat the night before (Friday night) States that the pian increased. Pain is intermittent  in LLQ that is shapping and then achy dull pain . No nausea or vomting. States that he feels the urge to have a Bm. Last BM this morning but not a full one, also with out blood   States that he is going to [facility] Wednesday   Past Medical History:  Diagnosis Date   Asthma    childhood   GERD (gastroesophageal reflux disease)    Hepatitis    age 77   Pre-diabetes    Pulmonary nodule 12/17/2018   needs follow up CT in 1 year    Past Surgical History:  Procedure Laterality Date   GANGLION CYST EXCISION Left    lymph node     lymph node removed under left arm due to cat scratch fever. as a kid    Family History  Problem Relation Age of Onset   Arthritis Mother    COPD Mother    Diabetes Mother    Heart disease Mother    Colon cancer Mother 69   Congestive Heart Failure Mother    Alcohol abuse Father    Drug abuse Father    Early death Father    Kidney disease Father    Cirrhosis Father    Asthma Sister     Social History   Socioeconomic History   Marital status: Single    Spouse name: Not on file   Number of children: 1   Years of education: GED, associate degree   Highest education level: Not on file  Occupational History   Occupation: works as an Chief Financial Officer  Tobacco Use   Smoking status: Every Day    Packs/day: 2.00    Years: 31.00    Pack years: 62.00    Types: Cigarettes   Smokeless tobacco: Former    Types:  Chew   Tobacco comments:    2 ppd x 30 years  Vaping Use   Vaping Use: Former   Start date: 02/27/2015   Quit date: 10/30/2015  Substance and Sexual Activity   Alcohol use: Yes    Comment: 1-2 times per year   Drug use: Not Currently   Sexual activity: Yes    Birth control/protection: Pill  Other Topics Concern   Not on file  Social History Narrative   Girlfriend - Elmo Putt - lives in [place] - 24 - just finished college   Enjoys - doesn't have a lot time for freetime   Works as a Freight forwarder at [employer]   Exercise: works, not regularly - time is a limiting factor   Diet: not great   Social Determinants of Radio broadcast assistant Strain: Not on file  Food Insecurity: Not on file  Transportation Needs: Not on file  Physical Activity: Not on file  Stress: Not on file  Social Connections: Not on file  Intimate Partner Violence: Not on file  Outpatient Medications Prior to Visit  Medication Sig Dispense Refill   valACYclovir (VALTREX) 500 MG tablet TAKE 1 TABLET BY MOUTH EVERY DAY 30 tablet 11   albuterol (VENTOLIN HFA) 108 (90 Base) MCG/ACT inhaler TAKE 2 PUFFS BY MOUTH EVERY 6 HOURS AS NEEDED FOR WHEEZE OR SHORTNESS OF BREATH 8.5 each 1   bifidobacterium infantis (ALIGN) capsule Take 1 capsule by mouth daily. (Patient not taking: Reported on 10/16/2021) 30 capsule 0   omeprazole (PRILOSEC) 20 MG capsule Take 1 capsule (20 mg total) by mouth daily. (Patient not taking: Reported on 12/25/2021) 30 capsule 0   aspirin EC 81 MG tablet Take 81 mg by mouth daily. Swallow whole.     sildenafil (REVATIO) 20 MG tablet Take 3 tablets PO PRN (Patient not taking: Reported on 10/16/2021) 10 tablet 1   No facility-administered medications prior to visit.    Allergies  Allergen Reactions   Penicillins     Vomiting     Review of Systems  Constitutional:  Positive for chills. Negative for fever.  Respiratory:  Negative for cough and shortness of  breath.   Gastrointestinal:  Positive for abdominal pain. Negative for diarrhea, nausea and vomiting.  Genitourinary:  Negative for difficulty urinating and hematuria.  Neurological:  Negative for headaches.      Objective:    Physical Exam Vitals and nursing note reviewed.  Constitutional:      Appearance: He is well-developed.  Cardiovascular:     Rate and Rhythm: Normal rate and regular rhythm.     Heart sounds: Normal heart sounds.  Pulmonary:     Effort: Pulmonary effort is normal.     Breath sounds: Normal breath sounds.  Abdominal:     General: There is no distension.     Palpations: There is no mass.     Tenderness: There is abdominal tenderness in the left upper quadrant. There is guarding. There is no right CVA tenderness or left CVA tenderness.    Neurological:     Mental Status: He is alert.    BP 130/90    Pulse 90    Temp 97.6 F (36.4 C)    Resp 14    Wt 269 lb 1 oz (122 kg)    SpO2 97%    BMI 33.63 kg/m  Wt Readings from Last 3 Encounters:  12/25/21 269 lb 1 oz (122 kg)  10/16/21 267 lb (121.1 kg)  07/26/21 260 lb (117.9 kg)    Health Maintenance Due  Topic Date Due   COVID-19 Vaccine (4 - Booster for Moderna series) 12/01/2020    There are no preventive care reminders to display for this patient.   Lab Results  Component Value Date   TSH 0.72 02/22/2017   Lab Results  Component Value Date   WBC 13.5 (H) 10/16/2021   HGB 17.0 10/16/2021   HCT 50.2 10/16/2021   MCV 92.9 10/16/2021   PLT 388.0 10/16/2021   Lab Results  Component Value Date   NA 139 10/16/2021   K 4.4 10/16/2021   CO2 28 10/16/2021   GLUCOSE 95 10/16/2021   BUN 22 10/16/2021   CREATININE 1.17 10/16/2021   BILITOT 0.4 10/16/2021   ALKPHOS 60 10/16/2021   AST 20 10/16/2021   ALT 24 10/16/2021   PROT 7.9 10/16/2021   ALBUMIN 4.6 10/16/2021   CALCIUM 10.1 10/16/2021   ANIONGAP 10 11/02/2019   GFR 74.13 10/16/2021   Lab Results  Component Value Date   CHOL 184  01/11/2021  Lab Results  Component Value Date   HDL 28.90 (L) 01/11/2021   Lab Results  Component Value Date   LDLCALC 103 02/22/2017   Lab Results  Component Value Date   TRIG 255.0 (H) 01/11/2021   Lab Results  Component Value Date   CHOLHDL 6 01/11/2021   Lab Results  Component Value Date   HGBA1C 6.0 09/06/2020       Assessment & Plan:   Problem List Items Addressed This Visit       Genitourinary   Other microscopic hematuria     Other   Left lower quadrant abdominal pain - Primary    Hx of diverticulitis. States that he is unsure if it is same type of diverticular pain or kidney stone pain. Did discuss with patient and we will get a CT of abdomen and pelvis with contrast along with basic labs      Relevant Orders   POCT urinalysis dipstick (Completed)   CT Abdomen Pelvis W Contrast   Basic metabolic panel   CBC with Differential/Platelet   Diverticulitis    Hx of the same has had several flares and treatments. Patient is complicated by having history of kidney stone with same presentation. Consistently has blood in urine. He has been referred to urology. Pending CT scan. Will treat with Cipro and flagyl       Relevant Medications   ciprofloxacin (CIPRO) 500 MG tablet   metroNIDAZOLE (FLAGYL) 500 MG tablet   Other Relevant Orders   Basic metabolic panel   CBC with Differential/Platelet     Meds ordered this encounter  Medications   ciprofloxacin (CIPRO) 500 MG tablet    Sig: Take 1 tablet (500 mg total) by mouth 2 (two) times daily for 7 days.    Dispense:  14 tablet    Refill:  0    Order Specific Question:   Supervising Provider    Answer:   Glori Bickers MARNE A [[id number]]   metroNIDAZOLE (FLAGYL) 500 MG tablet    Sig: Take 1 tablet (500 mg total) by mouth 3 (three) times daily.    Dispense:  21 tablet    Refill:  0    Order Specific Question:   Supervising Provider    Answer:   Loura Pardon A [[id number]]   This visit occurred during the SARS-CoV-2 public  health emergency.  Safety protocols were in place, including screening questions prior to the visit, additional usage of staff PPE, and extensive cleaning of exam room while observing appropriate contact time as indicated for disinfecting solutions.   Romilda Garret, NP

## 2021-12-25 NOTE — Telephone Encounter (Signed)
Spoke with patient and scheduled for 12/26/21

## 2021-12-26 ENCOUNTER — Encounter: Payer: Self-pay | Admitting: Family Medicine

## 2021-12-26 ENCOUNTER — Ambulatory Visit: Payer: BC Managed Care – PPO | Admitting: Nurse Practitioner

## 2021-12-26 DIAGNOSIS — N200 Calculus of kidney: Secondary | ICD-10-CM | POA: Insufficient documentation

## 2021-12-26 LAB — BASIC METABOLIC PANEL
BUN: 16 mg/dL (ref 6–23)
CO2: 26 mEq/L (ref 19–32)
Calcium: 9.4 mg/dL (ref 8.4–10.5)
Chloride: 105 mEq/L (ref 96–112)
Creatinine, Ser: 1.25 mg/dL (ref 0.40–1.50)
GFR: 68.38 mL/min (ref >60.00)
Glucose, Bld: 152 mg/dL — ABNORMAL HIGH (ref 70–99)
Potassium: 4.7 mEq/L (ref 3.5–5.1)
Sodium: 141 mEq/L (ref 135–145)

## 2022-04-02 ENCOUNTER — Ambulatory Visit (INDEPENDENT_AMBULATORY_CARE_PROVIDER_SITE_OTHER)
Admission: RE | Admit: 2022-04-02 | Discharge: 2022-04-02 | Disposition: A | Discharge status: Home / Self Care | Payer: BC Managed Care – PPO | Source: Ambulatory Visit | Attending: Nurse Practitioner | Admitting: Nurse Practitioner

## 2022-04-02 ENCOUNTER — Encounter: Payer: Self-pay | Admitting: Nurse Practitioner

## 2022-04-02 ENCOUNTER — Ambulatory Visit: Payer: BC Managed Care – PPO | Admitting: Nurse Practitioner

## 2022-04-02 ENCOUNTER — Encounter: Payer: Self-pay | Admitting: *Deleted

## 2022-04-02 VITALS — BP 130/82 | HR 74 | Temp 97.6°F | Resp 16 | Ht 75.0 in | Wt 273.0 lb

## 2022-04-02 DIAGNOSIS — K5792 Diverticulitis of intestine, part unspecified, without perforation or abscess without bleeding: Secondary | ICD-10-CM

## 2022-04-02 DIAGNOSIS — R1030 Lower abdominal pain, unspecified: Secondary | ICD-10-CM

## 2022-04-02 DIAGNOSIS — K5732 Diverticulitis of large intestine without perforation or abscess without bleeding: Secondary | ICD-10-CM | POA: Diagnosis not present

## 2022-04-02 DIAGNOSIS — R319 Hematuria, unspecified: Secondary | ICD-10-CM

## 2022-04-02 LAB — CBC
HCT: 46.6 % (ref 39.0–52.0)
Hemoglobin: 15.8 g/dL (ref 13.0–17.0)
MCHC: 33.9 g/dL (ref 30.0–36.0)
MCV: 93.6 fl (ref 78.0–100.0)
Platelets: 396 10*3/uL (ref 150.0–400.0)
RBC: 4.98 Mil/uL (ref 4.22–5.81)
RDW: 13.5 % (ref 11.5–15.5)
WBC: 13.9 10*3/uL — ABNORMAL HIGH (ref 4.0–10.5)

## 2022-04-02 LAB — POCT URINALYSIS DIPSTICK
Bilirubin, UA: NEGATIVE
Glucose, UA: NEGATIVE
Ketones, UA: NEGATIVE
Leukocytes, UA: NEGATIVE
Nitrite, UA: NEGATIVE
Protein, UA: POSITIVE — AB
Spec Grav, UA: 1.025 (ref 1.010–1.025)
Urobilinogen, UA: 0.2 E.U./dL
pH, UA: 6 (ref 5.0–8.0)

## 2022-04-02 MED ORDER — CIPROFLOXACIN HCL 500 MG PO TABS
500.0000 mg | ORAL_TABLET | Freq: Two times a day (BID) | ORAL | 0 refills | Status: AC
Start: 2022-04-02 — End: 2022-04-09

## 2022-04-02 MED ORDER — METRONIDAZOLE 500 MG PO TABS: 500.0000 mg | ORAL_TABLET | Freq: Three times a day (TID) | ORAL | 0 refills | Status: AC

## 2022-04-02 NOTE — Progress Notes (Signed)
Acute Office Visit  Subjective:     Patient ID: Frank Stevens, male    DOB: 07/26/1976, 46 y.o.   MRN: [id number]  Chief Complaint  Patient presents with   Abdominal Pain    Sx started about 1 to 2 days ago. A lot of gas, and pain in the lower abdomen. No vomiting no nausea.     Patient is in today for Abdominal pain   Started approx 2 days ago. States that he feels more of a gas feeling. States started in the morning and noticed.  Very little BM this morning with mucuse and gas  Feels like gas in his back. States feels better in a sitting, worse with standing. States it feels a pressure s   Review of Systems  Constitutional:  Negative for chills and fever.  Gastrointestinal:  Positive for abdominal pain. Negative for nausea and vomiting.  Genitourinary:  Negative for dysuria and hematuria.       Objective:    BP 130/82   Pulse 74   Temp 97.6 F (36.4 C)   Resp 16   Ht '6\' 3"'$  (1.905 m)   Wt 273 lb (123.8 kg)   SpO2 95%   BMI 34.12 kg/m    Physical Exam Vitals and nursing note reviewed.  Constitutional:      Appearance: He is well-developed.  Cardiovascular:     Rate and Rhythm: Normal rate and regular rhythm.  Pulmonary:     Effort: Pulmonary effort is normal.     Breath sounds: Normal breath sounds.  Abdominal:     General: Abdomen is flat.     Palpations: Abdomen is soft.     Tenderness: There is abdominal tenderness in the left lower quadrant. There is no right CVA tenderness or left CVA tenderness.    Skin:    General: Skin is warm.  Neurological:     Mental Status: He is alert.    Results for orders placed or performed in visit on 04/02/22  POCT urinalysis dipstick  Result Value Ref Range   Color, UA orange    Clarity, UA clear    Glucose, UA Negative Negative   Bilirubin, UA negative    Ketones, UA negative    Spec Grav, UA 1.025 1.010 - 1.025   Blood, UA large    pH, UA 6.0 5.0 - 8.0   Protein, UA Positive (A) Negative   Urobilinogen,  UA 0.2 0.2 or 1.0 E.U./dL   Nitrite, UA negative    Leukocytes, UA Negative Negative   Appearance     Odor          Assessment & Plan:   Problem List Items Addressed This Visit       Other   Hematuria   Relevant Orders   Ambulatory referral to Urology   Other Visit Diagnoses     Lower abdominal pain    -  Primary   Relevant Medications   ciprofloxacin (CIPRO) 500 MG tablet   metroNIDAZOLE (FLAGYL) 500 MG tablet   Other Relevant Orders   POCT urinalysis dipstick (Completed)   DG Abd 2 Views (Completed)   CBC       Meds ordered this encounter  Medications   ciprofloxacin (CIPRO) 500 MG tablet    Sig: Take 1 tablet (500 mg total) by mouth 2 (two) times daily for 7 days.    Dispense:  14 tablet    Refill:  0    Order Specific Question:   Supervising Provider  Answer:   Loura Pardon A [[id number]]   metroNIDAZOLE (FLAGYL) 500 MG tablet    Sig: Take 1 tablet (500 mg total) by mouth 3 (three) times daily for 7 days.    Dispense:  21 tablet    Refill:  0    Order Specific Question:   Supervising Provider    Answer:   TOWER, MARNE A [[id number]]    Return if symptoms worsen or fail to improve.  Romilda Garret, NP

## 2022-04-02 NOTE — Telephone Encounter (Signed)
Called patient appointment made for today.

## 2022-04-02 NOTE — Assessment & Plan Note (Signed)
Has been discussed with patient many times.  Will refer to urology since patient moved to [facility] we will choose [facility] facility

## 2022-04-02 NOTE — Assessment & Plan Note (Signed)
Exam consistent with diverticulitis.  Patient had many flares in the past.  He does see GI and has had a colonoscopy in the past.  We will treat with Cipro and metronidazole.  Follow-up if no improvement in symptoms does have a history of kidney stone that presented similar to diverticulitis we will obtain abdominal plain film today, pending result

## 2022-04-02 NOTE — Patient Instructions (Signed)
Nice to see you today I will be in touch with the results once I have them I sent in a weeks worth of antibiotics I will reach out to GI to let them know I also referred to urology

## 2022-04-05 NOTE — Telephone Encounter (Signed)
Due to symptoms from this flare up that he was seen on 04/02/22 by [facility] patient would like to get a work note covering him from going to work through [employer] if possible.

## 2022-08-05 DIAGNOSIS — N179 Acute kidney failure, unspecified: Secondary | ICD-10-CM | POA: Diagnosis not present

## 2022-08-05 DIAGNOSIS — D751 Secondary polycythemia: Secondary | ICD-10-CM | POA: Diagnosis not present

## 2022-08-05 DIAGNOSIS — Z79899 Other long term (current) drug therapy: Secondary | ICD-10-CM | POA: Diagnosis not present

## 2022-08-05 DIAGNOSIS — Z87442 Personal history of urinary calculi: Secondary | ICD-10-CM | POA: Diagnosis not present

## 2022-08-05 DIAGNOSIS — N132 Hydronephrosis with renal and ureteral calculous obstruction: Secondary | ICD-10-CM | POA: Diagnosis not present

## 2022-08-05 DIAGNOSIS — R310 Gross hematuria: Secondary | ICD-10-CM | POA: Diagnosis not present

## 2022-08-05 DIAGNOSIS — K760 Fatty (change of) liver, not elsewhere classified: Secondary | ICD-10-CM | POA: Diagnosis not present

## 2022-08-05 DIAGNOSIS — N289 Disorder of kidney and ureter, unspecified: Secondary | ICD-10-CM | POA: Diagnosis not present

## 2022-08-05 DIAGNOSIS — N1330 Unspecified hydronephrosis: Secondary | ICD-10-CM | POA: Diagnosis not present

## 2022-08-05 DIAGNOSIS — F17210 Nicotine dependence, cigarettes, uncomplicated: Secondary | ICD-10-CM | POA: Diagnosis not present

## 2022-08-05 DIAGNOSIS — I10 Essential (primary) hypertension: Secondary | ICD-10-CM | POA: Diagnosis not present

## 2022-08-05 DIAGNOSIS — R109 Unspecified abdominal pain: Secondary | ICD-10-CM | POA: Diagnosis not present

## 2022-08-05 DIAGNOSIS — N201 Calculus of ureter: Secondary | ICD-10-CM | POA: Diagnosis not present

## 2022-08-05 DIAGNOSIS — R110 Nausea: Secondary | ICD-10-CM | POA: Diagnosis not present

## 2022-08-05 DIAGNOSIS — N200 Calculus of kidney: Secondary | ICD-10-CM | POA: Diagnosis not present

## 2022-08-05 DIAGNOSIS — Z466 Encounter for fitting and adjustment of urinary device: Secondary | ICD-10-CM | POA: Diagnosis not present

## 2022-08-05 DIAGNOSIS — I700 Atherosclerosis of aorta: Secondary | ICD-10-CM | POA: Diagnosis not present

## 2022-08-05 DIAGNOSIS — J45909 Unspecified asthma, uncomplicated: Secondary | ICD-10-CM | POA: Diagnosis not present

## 2022-08-05 DIAGNOSIS — K5730 Diverticulosis of large intestine without perforation or abscess without bleeding: Secondary | ICD-10-CM | POA: Diagnosis not present

## 2022-08-05 DIAGNOSIS — N139 Obstructive and reflux uropathy, unspecified: Secondary | ICD-10-CM | POA: Diagnosis not present

## 2022-08-14 DIAGNOSIS — N201 Calculus of ureter: Secondary | ICD-10-CM | POA: Diagnosis not present

## 2022-08-14 DIAGNOSIS — Z960 Presence of urogenital implants: Secondary | ICD-10-CM | POA: Diagnosis not present

## 2022-08-23 DIAGNOSIS — Z87442 Personal history of urinary calculi: Secondary | ICD-10-CM | POA: Diagnosis not present

## 2022-08-23 DIAGNOSIS — Z960 Presence of urogenital implants: Secondary | ICD-10-CM | POA: Diagnosis not present

## 2022-08-23 DIAGNOSIS — N201 Calculus of ureter: Secondary | ICD-10-CM | POA: Diagnosis not present

## 2022-09-12 DIAGNOSIS — D751 Secondary polycythemia: Secondary | ICD-10-CM | POA: Diagnosis not present

## 2022-09-12 DIAGNOSIS — K5792 Diverticulitis of intestine, part unspecified, without perforation or abscess without bleeding: Secondary | ICD-10-CM | POA: Diagnosis not present

## 2022-09-12 DIAGNOSIS — I10 Essential (primary) hypertension: Secondary | ICD-10-CM | POA: Diagnosis not present

## 2022-09-12 DIAGNOSIS — N201 Calculus of ureter: Secondary | ICD-10-CM | POA: Diagnosis not present

## 2022-09-12 DIAGNOSIS — Z87891 Personal history of nicotine dependence: Secondary | ICD-10-CM | POA: Diagnosis not present

## 2022-09-12 DIAGNOSIS — Z79899 Other long term (current) drug therapy: Secondary | ICD-10-CM | POA: Diagnosis not present

## 2022-09-12 DIAGNOSIS — E785 Hyperlipidemia, unspecified: Secondary | ICD-10-CM | POA: Diagnosis not present

## 2023-02-24 IMAGING — CT CT ABD-PELV W/ CM
2 of 5 series · 16 of 46 positions shown, 18 images · IV contrast (APPLIED)
Comparison: 07/17/2021

CLINICAL DATA: Follow-up recent diverticulitis, abdominal pain,
bloating, gas, antibiotic treatment

EXAM:
CT ABDOMEN AND PELVIS WITH CONTRAST
TECHNIQUE: Multidetector CT imaging of the abdomen and pelvis was performed
using the standard protocol following bolus administration of
intravenous contrast.
CONTRAST:  80mL OMNIPAQUE IOHEXOL 350 MG/ML SOLN, additional oral
enteric contrast

[Series 2: routine abd/pel with · axial · 0.92mm/px · z∈[-697,-252]mm · 13 of 101 slices shown, 15 images]
[im 6/101  soft-tissue]
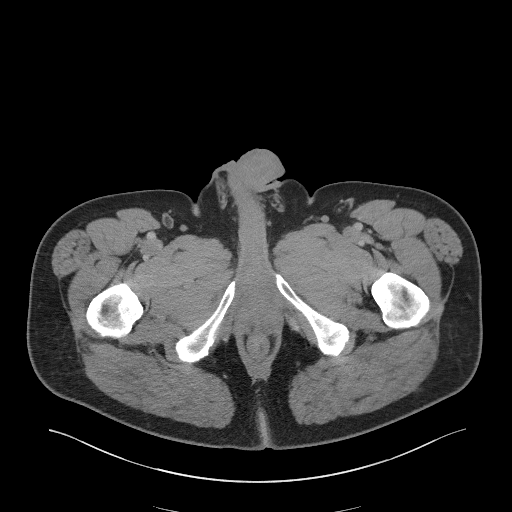
[im 6/101  bone]
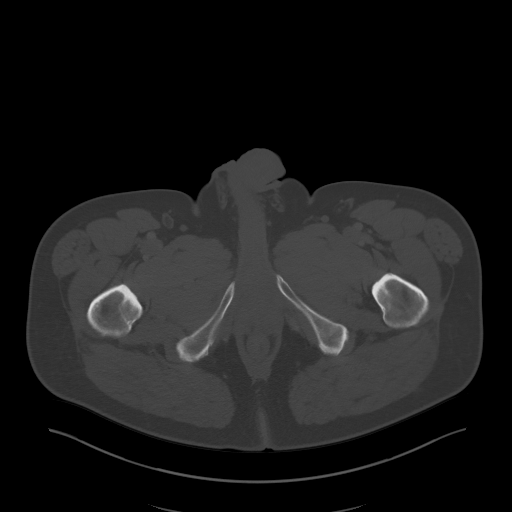
[im 16/101  soft-tissue]
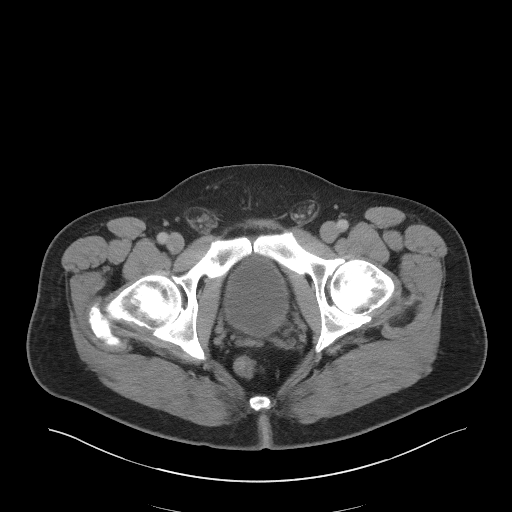
[im 22/101  soft-tissue]
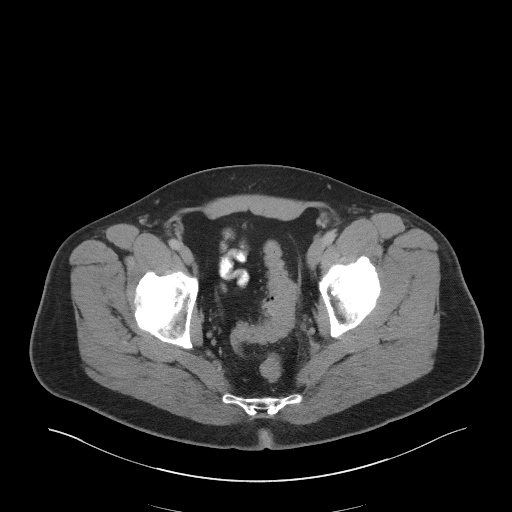
[im 27/101  soft-tissue]
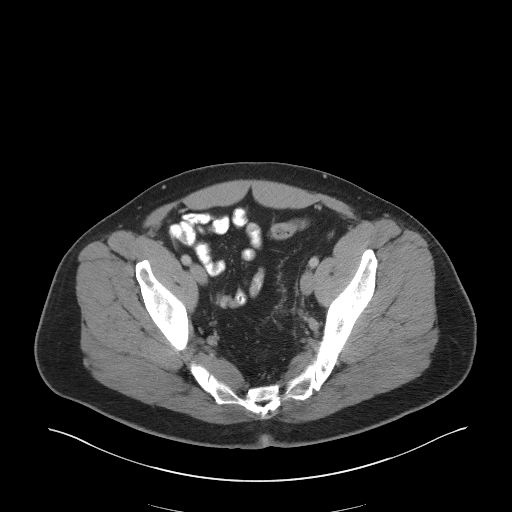
[im 37/101  soft-tissue]
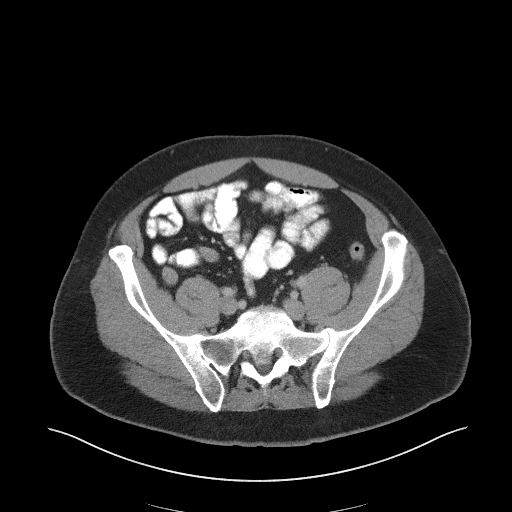
[im 43/101  soft-tissue]
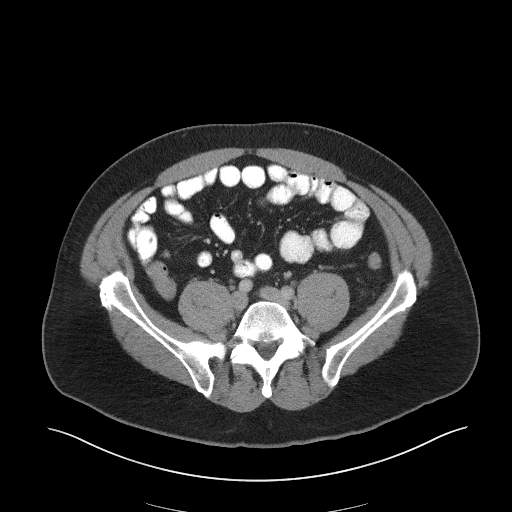
[im 53/101  soft-tissue]
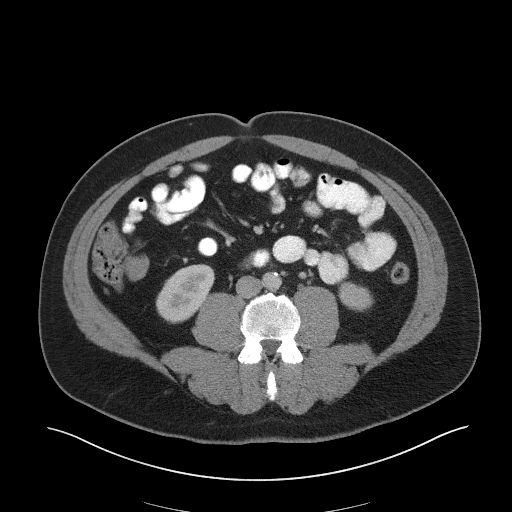
[im 58/101  soft-tissue]
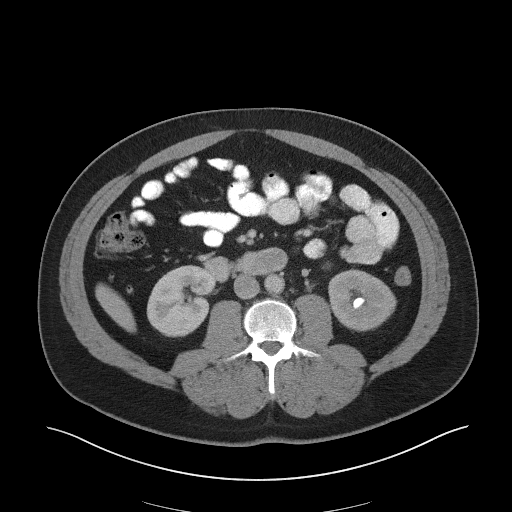
[im 64/101  soft-tissue]
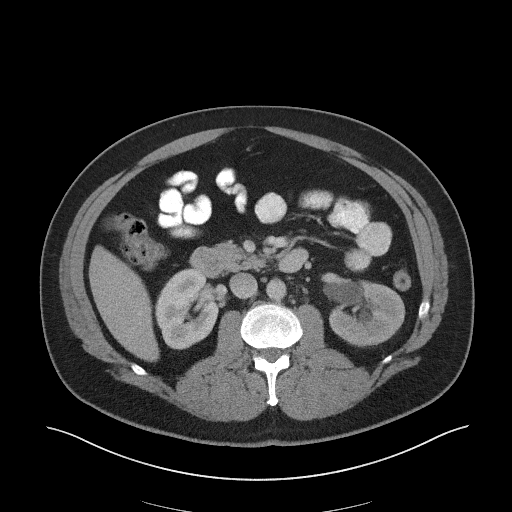
[im 64/101  bone]
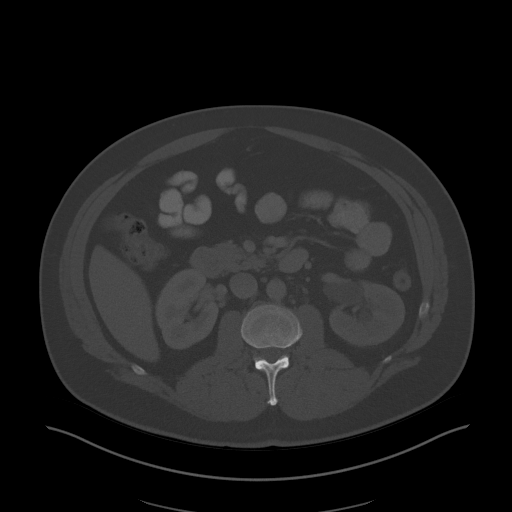
[im 74/101  soft-tissue]
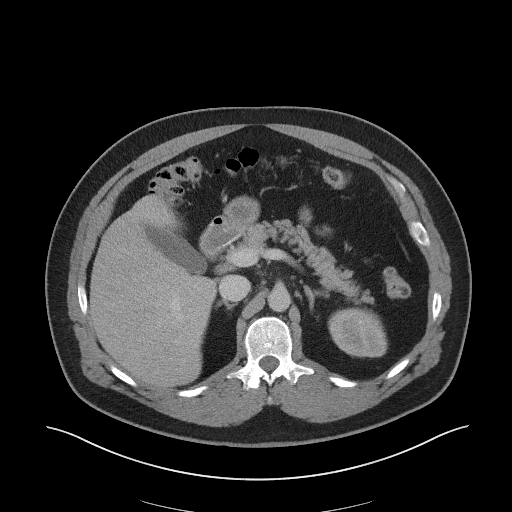
[im 79/101  soft-tissue]
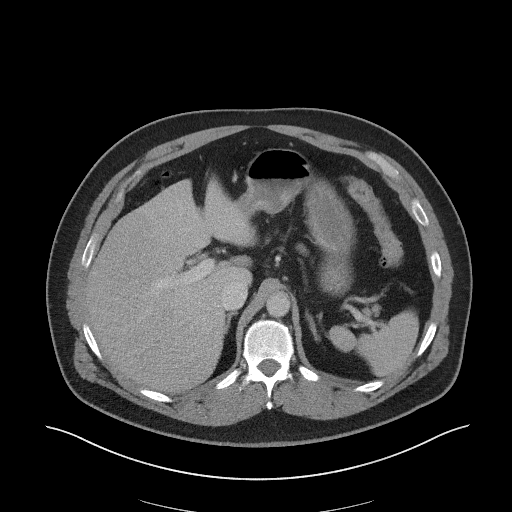
[im 85/101  soft-tissue]
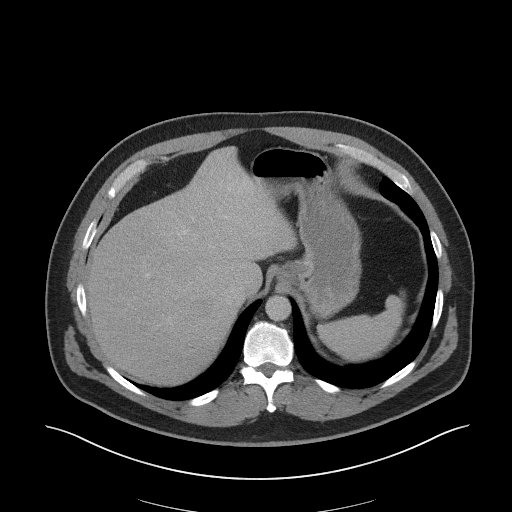
[im 95/101  soft-tissue]
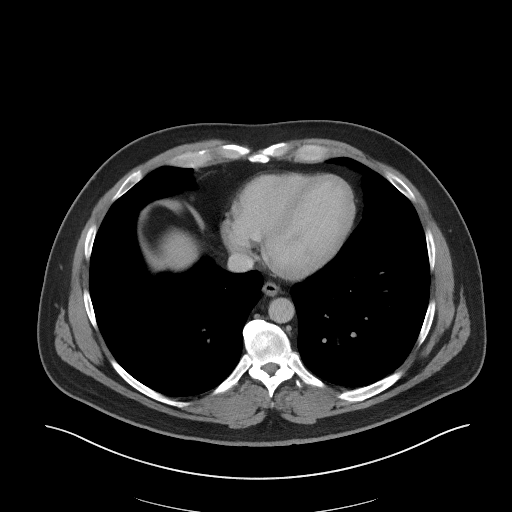

[Series 5: coronal st · coronal · 0.86mm/px · 3 of 107 slices shown]
[im 36/107  soft-tissue]
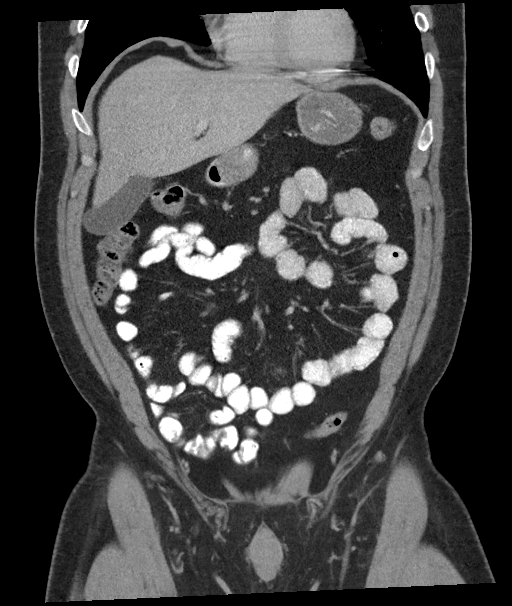
[im 48/107  soft-tissue]
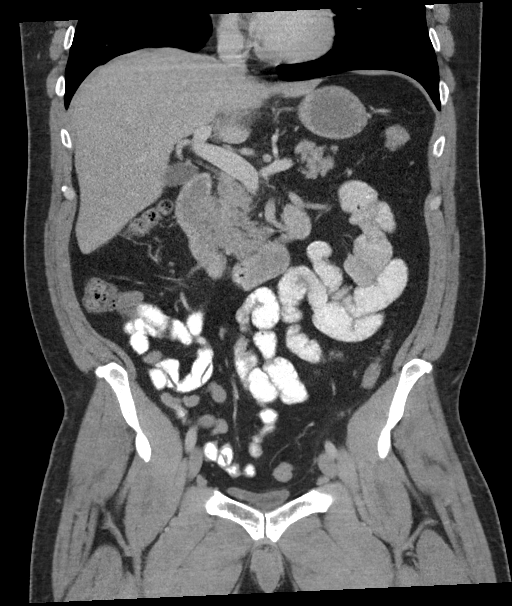
[im 59/107  soft-tissue]
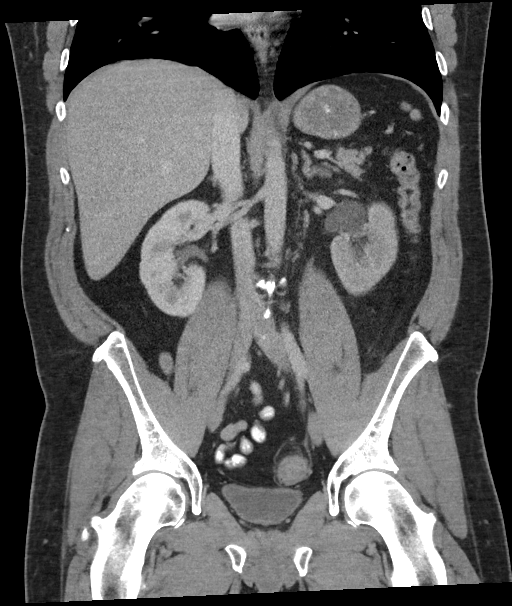

[16 of 46 positions shown; findings below may reference images not displayed]

FINDINGS: Lower chest: No acute abnormality.

Hepatobiliary: No solid liver abnormality is seen. No gallstones,
gallbladder wall thickening, or biliary dilatation.

Pancreas: Unremarkable. No pancreatic ductal dilatation or
surrounding inflammatory changes.

Spleen: Normal in size without significant abnormality.

Adrenals/Urinary Tract: Adrenal glands are unremarkable. There is a
2 mm calculus at the left ureterovesicular junction with mild left
hydronephrosis and hydroureter, as well as a delayed left
nephrogram. Additional bladder is unremarkable.

Stomach/Bowel: Stomach is within normal limits. Appendix appears
normal. Descending and sigmoid diverticulosis. Improved, although
persistent wall thickening about the mid to distal sigmoid colon
(series 2, image 79).

Vascular/Lymphatic: Aortic atherosclerosis. No enlarged abdominal or
pelvic lymph nodes.

Reproductive: No mass or other significant abnormality.

Other: No abdominal wall hernia or abnormality. No abdominopelvic
ascites.

Musculoskeletal: No acute or significant osseous findings.
IMPRESSION: 1. There is a 2 mm calculus at the left ureterovesicular junction
with mild left hydronephrosis and hydroureter, as well as a delayed
left nephrogram.
2. Additional nonobstructive calculus of the inferior pole of the
left kidney. No right-sided calculi.
3. Improved, although persistent wall thickening about the mid to
distal sigmoid colon, consistent with improved diverticulitis. No
evidence of complicating perforation or abscess at this time.

These results will be called to the ordering clinician or
representative by the Radiologist Assistant, and communication
documented in the PACS or [REDACTED].

Aortic Atherosclerosis (UYCLD-MCM.M).

## 2023-10-16 IMAGING — DX DG ABDOMEN 2V
4 series · 4 of 4 positions shown · non-contrast
Comparison: 12/25/2021

CLINICAL DATA: Patient has had diverticulitis for one and a half
years total.

EXAM:
ABDOMEN - 2 VIEW

[abdomen supine ap (1 of 2)]
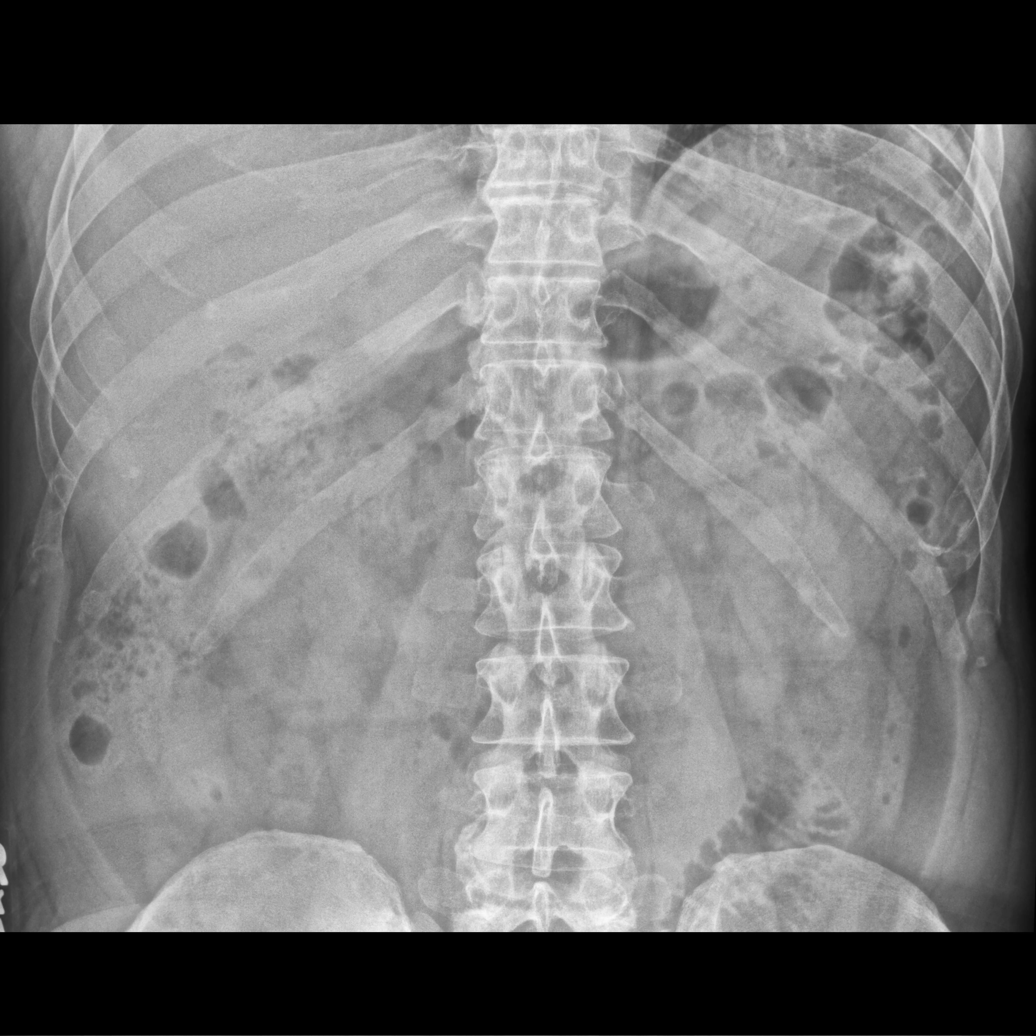

[abdomen standing ap (1 of 2)]
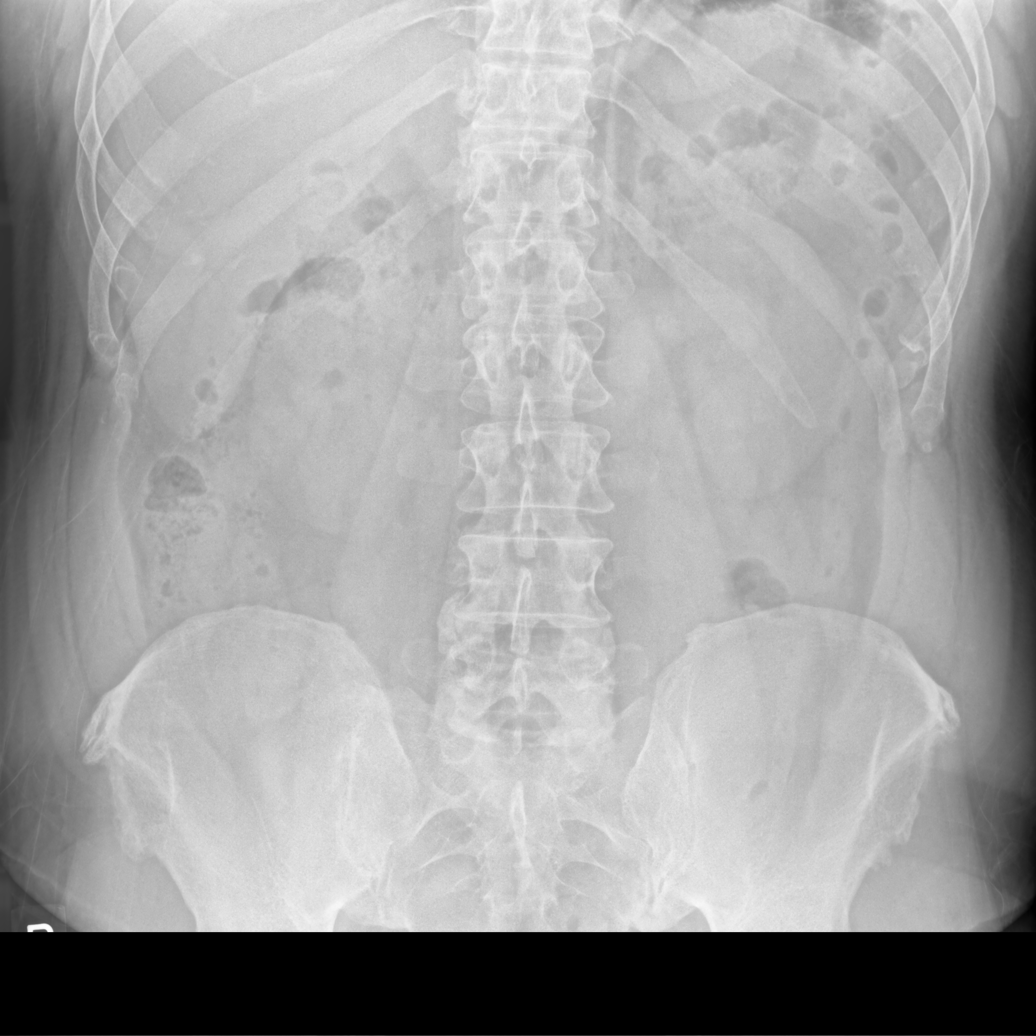

[abdomen supine ap (2 of 2)]
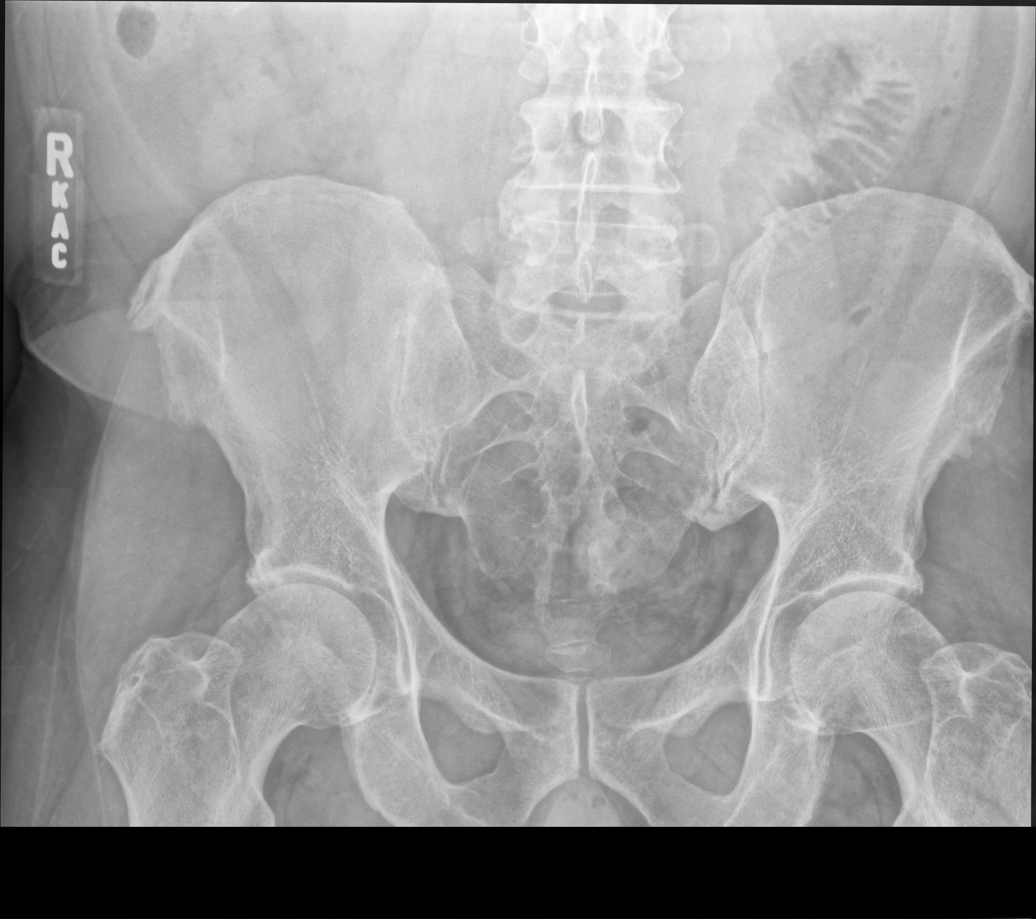

[abdomen standing ap (2 of 2)]
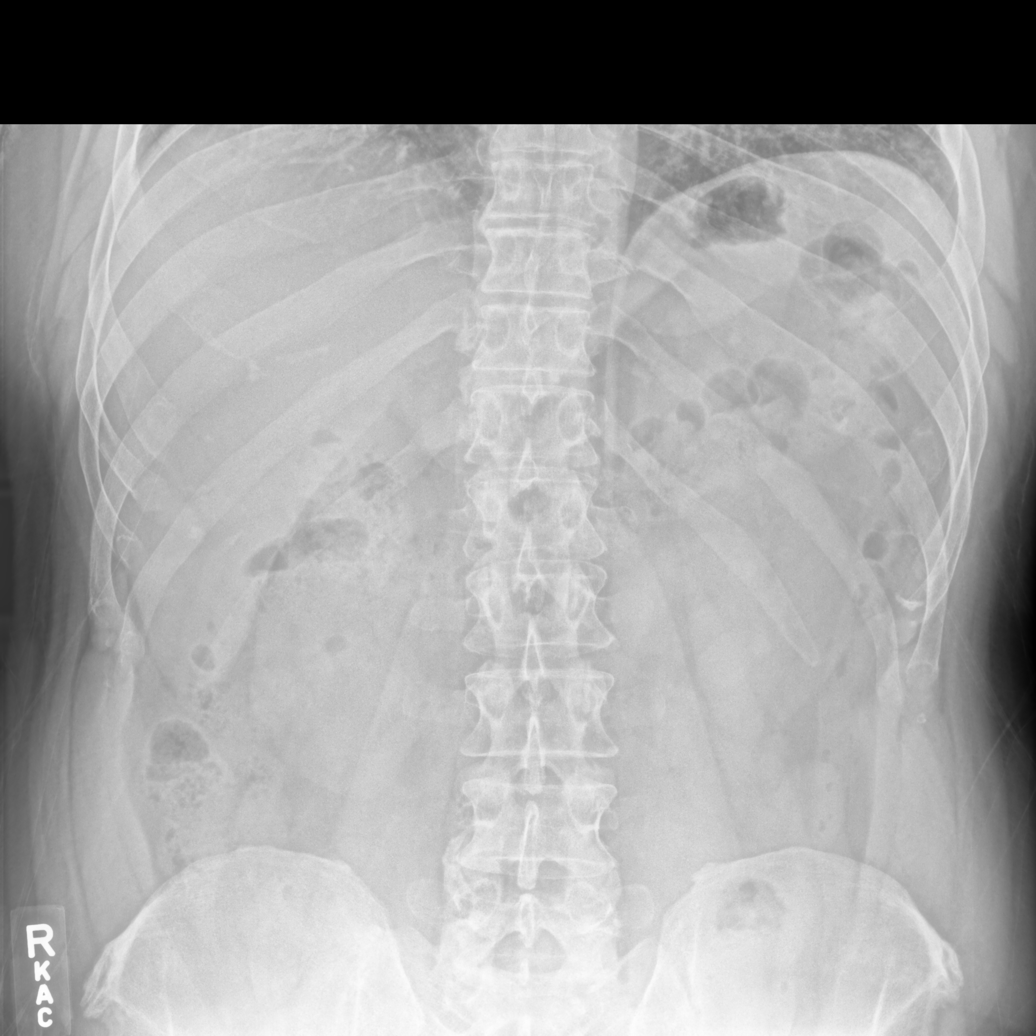

[4 of 4 positions shown; findings below may reference images not displayed]

FINDINGS: The bowel gas pattern is normal. There is no evidence of free air.
No radio-opaque calculi or other significant radiographic
abnormality is seen.
IMPRESSION: Negative.
